# Patient Record
Sex: Male | Born: 1965 | Race: White | Hispanic: No | Marital: Married | Smoking: Never smoker
Health system: Southern US, Community
[De-identification: ages and names within clinical notes are randomized; demographics above are authoritative.]

## PROBLEM LIST (undated history)

## (undated) DIAGNOSIS — E785 Hyperlipidemia, unspecified: Secondary | ICD-10-CM

## (undated) HISTORY — PX: NECK SURGERY: SHX720

## (undated) HISTORY — PX: SHOULDER SURGERY: SHX246

## (undated) HISTORY — PX: TONSILLECTOMY: SUR1361

---

## 2005-05-09 ENCOUNTER — Emergency Department (HOSPITAL_COMMUNITY): Admission: EM | Admit: 2005-05-09 | Discharge: 2005-05-09 | Payer: Self-pay | Admitting: Emergency Medicine

## 2005-06-03 ENCOUNTER — Ambulatory Visit (HOSPITAL_COMMUNITY)
Admission: RE | Admit: 2005-06-03 | Discharge: 2005-06-03 | Payer: Self-pay | Admitting: Physical Medicine and Rehabilitation

## 2008-07-14 ENCOUNTER — Emergency Department (HOSPITAL_COMMUNITY): Admission: EM | Admit: 2008-07-14 | Discharge: 2008-07-14 | Payer: Self-pay | Admitting: Emergency Medicine

## 2010-04-16 LAB — COMPREHENSIVE METABOLIC PANEL
ALT: 27 U/L (ref 0–53)
AST: 23 U/L (ref 0–37)
Albumin: 4.6 g/dL (ref 3.5–5.2)
Alkaline Phosphatase: 88 U/L (ref 39–117)
BUN: 12 mg/dL (ref 6–23)
CO2: 25 mEq/L (ref 19–32)
Calcium: 9.8 mg/dL (ref 8.4–10.5)
Chloride: 105 mEq/L (ref 96–112)
Creatinine, Ser: 1 mg/dL (ref 0.4–1.5)
GFR calc Af Amer: >60 mL/min (ref >60)
GFR calc non Af Amer: >60 mL/min (ref >60)
Glucose, Bld: 91 mg/dL (ref 70–99)
Potassium: 4.3 mEq/L (ref 3.5–5.1)
Sodium: 139 mEq/L (ref 135–145)
Total Bilirubin: 0.5 mg/dL (ref 0.3–1.2)
Total Protein: 7.6 g/dL (ref 6.0–8.3)

## 2010-04-16 LAB — DIFFERENTIAL
Basophils Absolute: 0.1 10*3/uL (ref 0.0–0.1)
Basophils Relative: 1 % (ref 0–1)
Eosinophils Absolute: 0.2 10*3/uL (ref 0.0–0.7)
Eosinophils Relative: 3 % (ref 0–5)
Lymphocytes Relative: 26 % (ref 12–46)
Lymphs Abs: 1.9 10*3/uL (ref 0.7–4.0)
Monocytes Absolute: 0.5 10*3/uL (ref 0.1–1.0)
Monocytes Relative: 7 % (ref 3–12)
Neutro Abs: 4.7 10*3/uL (ref 1.7–7.7)
Neutrophils Relative %: 64 % (ref 43–77)

## 2010-04-16 LAB — URINALYSIS, ROUTINE W REFLEX MICROSCOPIC
Bilirubin Urine: NEGATIVE
Glucose, UA: NEGATIVE mg/dL
Hgb urine dipstick: NEGATIVE
Ketones, ur: NEGATIVE mg/dL
Nitrite: NEGATIVE
Protein, ur: NEGATIVE mg/dL
Specific Gravity, Urine: 1.012 (ref 1.005–1.030)
Urobilinogen, UA: 0.2 mg/dL (ref 0.0–1.0)
pH: 6 (ref 5.0–8.0)

## 2010-04-16 LAB — CBC
HCT: 45.7 % (ref 39.0–52.0)
Hemoglobin: 16 g/dL (ref 13.0–17.0)
MCHC: 35 g/dL (ref 30.0–36.0)
MCV: 86.1 fL (ref 78.0–100.0)
Platelets: 236 10*3/uL (ref 150–400)
RBC: 5.3 MIL/uL (ref 4.22–5.81)
RDW: 13.2 % (ref 11.5–15.5)
WBC: 7.3 10*3/uL (ref 4.0–10.5)

## 2010-04-16 LAB — LIPASE, BLOOD: Lipase: 22 U/L (ref 11–59)

## 2012-12-30 ENCOUNTER — Encounter: Payer: Self-pay | Admitting: Emergency Medicine

## 2012-12-30 ENCOUNTER — Emergency Department (INDEPENDENT_AMBULATORY_CARE_PROVIDER_SITE_OTHER)
Admission: EM | Admit: 2012-12-30 | Discharge: 2012-12-30 | Disposition: A | Discharge status: Home / Self Care | Payer: Managed Care, Other (non HMO) | Source: Home / Self Care | Attending: Family Medicine | Admitting: Family Medicine

## 2012-12-30 DIAGNOSIS — Z113 Encounter for screening for infections with a predominantly sexual mode of transmission: Secondary | ICD-10-CM

## 2012-12-30 HISTORY — DX: Hyperlipidemia, unspecified: E78.5

## 2012-12-30 NOTE — ED Provider Notes (Signed)
CSN: [id number]     Arrival date & time 12/30/12  1740 History   First MD Initiated Contact with Patient 12/30/12 1758     Chief Complaint  Patient presents with  . Exposure to STD     HPI Comments: Patient reports that his wife has just been diagnosed with a herpes-like genital rash.  Patient presents for Herpes testing.  He is completely assymptomatic at present.  He states that he has never had an STD, although he has had an occasional cold sore in the past.  The history is provided by the patient and the spouse.    Past Medical History  Diagnosis Date  . Hyperlipidemia    Past Surgical History  Procedure Laterality Date  . Neck surgery    . Shoulder surgery Left    Family History  Problem Relation Age of Onset  . Hypertension Father   . Heart failure Father    History  Substance Use Topics  . Smoking status: Never Smoker   . Smokeless tobacco: Not on file  . Alcohol Use: No    Review of Systems  Constitutional: Negative.   HENT: Negative.   Eyes: Negative.   Respiratory: Negative.   Cardiovascular: Negative.   Gastrointestinal: Negative.   Endocrine: Negative.   Genitourinary: Negative.   Musculoskeletal: Negative.   Skin: Negative.     Allergies  Penicillins  Home Medications   Current Outpatient Rx  Name  Route  Sig  Dispense  Refill  . rosuvastatin (CRESTOR) 20 MG tablet   Oral   Take 20 mg by mouth daily.          BP 128/81  Pulse 89  Temp(Src) 98.1 F (36.7 C) (Oral)  Resp 16  Ht 6\' 1"  (1.854 m)  Wt 213 lb (96.616 kg)  BMI 28.11 kg/m2  SpO2 95% Physical Exam Nursing notes and Vital Signs reviewed. Appearance:  Patient appears healthy, stated age, and in no acute distress Eyes:  Pupils are equal, round, and reactive to light and accomodation.  Extraocular movement is intact.  Conjunctivae are not inflamed .  Pharynx:  Normal Neck:  Supple.  No adenopathy Lungs:  Clear to auscultation.  Breath sounds are equal.  Heart:  Regular rate  and rhythm without murmurs, rubs, or gallops.  Skin:  No rash present.   ED Course  Procedures  none    MDM   1. Screen for STD (sexually transmitted disease)    Check for HSV 1 & 2 antibodies, IgG and Igm Info on [employer] given    Lattie Haw, MD 12/30/12 [phone]

## 2012-12-30 NOTE — ED Notes (Signed)
Uncertain STD exposure to HSV; desires testing.

## 2012-12-31 LAB — HSV(HERPES SIMPLEX VRS) I + II AB-IGG: HSV 1 Glycoprotein G Ab, IgG: <0.1 IV

## 2013-01-02 LAB — HSV(HERPES SIMPLEX VRS) I + II AB-IGM: Herpes Simplex Vrs I&II-IgM Ab (EIA): 0.45 INDEX

## 2013-01-03 ENCOUNTER — Telehealth: Payer: Self-pay | Admitting: Emergency Medicine

## 2013-09-07 ENCOUNTER — Encounter: Payer: Self-pay | Admitting: Emergency Medicine

## 2013-09-07 ENCOUNTER — Emergency Department (INDEPENDENT_AMBULATORY_CARE_PROVIDER_SITE_OTHER)
Admission: EM | Admit: 2013-09-07 | Discharge: 2013-09-07 | Disposition: A | Discharge status: Home / Self Care | Payer: BC Managed Care – PPO | Source: Home / Self Care | Attending: Family Medicine | Admitting: Family Medicine

## 2013-09-07 ENCOUNTER — Emergency Department (INDEPENDENT_AMBULATORY_CARE_PROVIDER_SITE_OTHER): Payer: BC Managed Care – PPO

## 2013-09-07 DIAGNOSIS — J209 Acute bronchitis, unspecified: Secondary | ICD-10-CM

## 2013-09-07 DIAGNOSIS — R509 Fever, unspecified: Secondary | ICD-10-CM

## 2013-09-07 DIAGNOSIS — R05 Cough: Secondary | ICD-10-CM

## 2013-09-07 DIAGNOSIS — R059 Cough, unspecified: Secondary | ICD-10-CM

## 2013-09-07 DIAGNOSIS — R0989 Other specified symptoms and signs involving the circulatory and respiratory systems: Secondary | ICD-10-CM

## 2013-09-07 LAB — POCT CBC W AUTO DIFF (K'VILLE URGENT CARE)

## 2013-09-07 LAB — POCT INFLUENZA A/B
INFLUENZA A, POC: NEGATIVE
INFLUENZA B, POC: NEGATIVE

## 2013-09-07 MED ORDER — BENZONATATE 200 MG PO CAPS: 200.0000 mg | ORAL_CAPSULE | Freq: Every day | ORAL | Status: DC

## 2013-09-07 MED ORDER — AZITHROMYCIN 250 MG PO TABS: ORAL_TABLET | ORAL | Status: DC

## 2013-09-07 NOTE — Discharge Instructions (Signed)
Take plain Mucinex (1200 mg guaifenesin) twice daily for cough and congestion.  May add Sudafed for sinus congestion.   Increase fluid intake, rest. °May use Afrin nasal spray (or generic oxymetazoline) twice daily for about 5 days.  Also recommend using saline nasal spray several times daily and saline nasal irrigation (AYR is a common brand) °Try warm salt water gargles for sore throat.  °Stop all antihistamines for now, and other non-prescription cough/cold preparations. °May take Ibuprofen 200mg, 4 tabs every 8 hours with food for chest/sternum discomfort. °  °Follow-up with family doctor if not improving 7 to 10 days.  °

## 2013-09-07 NOTE — ED Provider Notes (Signed)
CSN: [id number]     Arrival date & time 09/07/13  8295 History   First MD Initiated Contact with Patient 09/07/13 [phone]     Chief Complaint  Patient presents with  . Cough  . Fever      HPI Comments: Patient developed fatigue and chills four days ago.  The next day he developed chest congestion and tightness in his anterior chest.  Two days ago he developed swollen lymph nodes in neck, myalgias, sinus congestion and sore throat.  Yesterday he had fever to 102.  He now has a cough, worse at night, and he complains of bilateral earache.  The history is provided by the patient.    Past Medical History  Diagnosis Date  . Hyperlipidemia    Past Surgical History  Procedure Laterality Date  . Neck surgery    . Shoulder surgery Left   . Tonsillectomy     Family History  Problem Relation Age of Onset  . Hypertension Father   . Heart failure Father    History  Substance Use Topics  . Smoking status: Never Smoker   . Smokeless tobacco: Not on file  . Alcohol Use: Yes     Comment: 2 drinks q wk    Review of Systems + sore throat + cough No pleuritic pain, but has tightness in anterior chest + wheezing + nasal congestion + post-nasal drainage No sinus pain/pressure No itchy/red eyes + earache No hemoptysis No SOB + fever, + chills No nausea No vomiting No abdominal pain No diarrhea No urinary symptoms No skin rash + fatigue + myalgias + headache Used OTC meds without relief  Allergies  Penicillins  Home Medications   Prior to Admission medications   Medication Sig Start Date End Date Taking? Authorizing Provider  azithromycin (ZITHROMAX Z-PAK) 250 MG tablet Take 2 tabs today; then begin one tab once daily for 4 more days. 09/07/13   Lattie Haw, MD  benzonatate (TESSALON) 200 MG capsule Take 1 capsule (200 mg total) by mouth at bedtime. Take as needed for cough 09/07/13   Lattie Haw, MD  rosuvastatin (CRESTOR) 20 MG tablet Take 20 mg by mouth daily.     Historical Provider, MD   BP 110/72  Pulse 105  Temp(Src) 99.8 F (37.7 C) (Oral)  Resp 18  Ht  (1.854 m)  Wt 217 lb (98.431 kg)  BMI 28.64 kg/m2  SpO2 96% Physical Exam Nursing notes and Vital Signs reviewed. Appearance:  Patient appears healthy, stated age, and in no acute distress Eyes:  Pupils are equal, round, and reactive to light and accomodation.  Extraocular movement is intact.  Conjunctivae are not inflamed  Ears:  Canals normal.  Tympanic membranes normal.  Nose:  Mildly congested turbinates.  No sinus tenderness.   Pharynx:  Normal Neck:  Supple.   Tender enlarged posterior nodes are palpated bilaterally  Lungs:  Clear to auscultation.  Breath sounds are equal.  Chest:  Distinct tenderness to palpation over the mid-sternum.  Heart:  Regular rate and rhythm without murmurs, rubs, or gallops.  Abdomen:  Nontender without masses or hepatosplenomegaly.  Bowel sounds are present.  No CVA or flank tenderness.  Extremities:  No edema.  No calf tenderness Skin:  No rash present.   ED Course  Procedures  None    Labs Reviewed  POCT CBC W AUTO DIFF ([facility]):  WBC 9.2; LY 14.7; MO 4.5; GR 80.8; Hgb 15.2; Platelets 196   POCT INFLUENZA A/B  negative    Imaging Review Dg Chest 2 View  09/07/2013   CLINICAL DATA:  Cough fever and congestion for 4 days  EXAM: CHEST  2 VIEW  COMPARISON:  None.  FINDINGS: The lungs are mildly hypoinflated. There is no focal infiltrate. The lung markings are coarse inferiorly on the right but this may be due to crowding. The cardiac silhouette is top-normal in size. The pulmonary vascularity is normal. There is no pleural effusion. The mediastinum is normal in width. The bony thorax is unremarkable.  IMPRESSION: There is no definite acute pneumonia. Infrahilar subsegmental atelectasis on the right is not excluded. If the patient can tolerate the procedure, a deep inspiratory PA and lateral chest x-ray would be useful.   Electronically  Signed   By: David  [place]   On: 09/07/2013 10:22     MDM   1. Acute bronchitis, unspecified organism    Begin Z-pack to cover atypicals.  Prescription written for Benzonatate [facility]) to take at bedtime for night-time cough.  Take plain Mucinex (1200 mg guaifenesin) twice daily for cough and congestion.  May add Sudafed for sinus congestion.   Increase fluid intake, rest. May use Afrin nasal spray (or generic oxymetazoline) twice daily for about 5 days.  Also recommend using saline nasal spray several times daily and saline nasal irrigation (AYR is a common brand) Try warm salt water gargles for sore throat.  Stop all antihistamines for now, and other non-prescription cough/cold preparations. May take Ibuprofen , 4 tabs every 8 hours with food for chest/sternum discomfort.   Follow-up with family doctor if not improving 7 to 10 days.     Lattie Haw, MD 09/12/13 [phone]

## 2013-09-07 NOTE — ED Notes (Signed)
Pt c/o nonproductive cough, fever, swollen lymph nodes, and bilateral ear ache x 3 days. He took IBF  at 0600, he reports his temp was 102F.

## 2013-09-09 ENCOUNTER — Telehealth: Payer: Self-pay | Admitting: *Deleted

## 2015-03-24 ENCOUNTER — Other Ambulatory Visit: Payer: Self-pay

## 2015-03-24 ENCOUNTER — Emergency Department (HOSPITAL_COMMUNITY): Payer: BLUE CROSS/BLUE SHIELD

## 2015-03-24 ENCOUNTER — Observation Stay (HOSPITAL_COMMUNITY)
Admission: EM | Admit: 2015-03-24 | Discharge: 2015-03-25 | Disposition: A | Discharge status: Home / Self Care | Payer: BLUE CROSS/BLUE SHIELD | Attending: Oncology | Admitting: Oncology

## 2015-03-24 ENCOUNTER — Observation Stay (HOSPITAL_COMMUNITY): Payer: BLUE CROSS/BLUE SHIELD

## 2015-03-24 ENCOUNTER — Encounter (HOSPITAL_COMMUNITY): Payer: Self-pay | Admitting: Emergency Medicine

## 2015-03-24 DIAGNOSIS — R0789 Other chest pain: Secondary | ICD-10-CM | POA: Diagnosis not present

## 2015-03-24 DIAGNOSIS — Z8719 Personal history of other diseases of the digestive system: Secondary | ICD-10-CM

## 2015-03-24 DIAGNOSIS — K219 Gastro-esophageal reflux disease without esophagitis: Secondary | ICD-10-CM | POA: Diagnosis present

## 2015-03-24 DIAGNOSIS — R531 Weakness: Secondary | ICD-10-CM | POA: Insufficient documentation

## 2015-03-24 DIAGNOSIS — E876 Hypokalemia: Secondary | ICD-10-CM | POA: Diagnosis not present

## 2015-03-24 DIAGNOSIS — R0602 Shortness of breath: Secondary | ICD-10-CM | POA: Diagnosis not present

## 2015-03-24 DIAGNOSIS — Z8249 Family history of ischemic heart disease and other diseases of the circulatory system: Secondary | ICD-10-CM | POA: Insufficient documentation

## 2015-03-24 DIAGNOSIS — Z8661 Personal history of infections of the central nervous system: Secondary | ICD-10-CM

## 2015-03-24 DIAGNOSIS — N62 Hypertrophy of breast: Secondary | ICD-10-CM | POA: Diagnosis not present

## 2015-03-24 DIAGNOSIS — J90 Pleural effusion, not elsewhere classified: Secondary | ICD-10-CM | POA: Diagnosis not present

## 2015-03-24 DIAGNOSIS — Z880 Allergy status to penicillin: Secondary | ICD-10-CM | POA: Diagnosis not present

## 2015-03-24 DIAGNOSIS — J189 Pneumonia, unspecified organism: Secondary | ICD-10-CM

## 2015-03-24 DIAGNOSIS — Z6828 Body mass index (BMI) 28.0-28.9, adult: Secondary | ICD-10-CM | POA: Insufficient documentation

## 2015-03-24 DIAGNOSIS — R091 Pleurisy: Secondary | ICD-10-CM

## 2015-03-24 DIAGNOSIS — J9811 Atelectasis: Secondary | ICD-10-CM | POA: Insufficient documentation

## 2015-03-24 DIAGNOSIS — Z8711 Personal history of peptic ulcer disease: Secondary | ICD-10-CM

## 2015-03-24 DIAGNOSIS — E785 Hyperlipidemia, unspecified: Secondary | ICD-10-CM | POA: Diagnosis present

## 2015-03-24 DIAGNOSIS — Z87442 Personal history of urinary calculi: Secondary | ICD-10-CM | POA: Diagnosis present

## 2015-03-24 DIAGNOSIS — R079 Chest pain, unspecified: Secondary | ICD-10-CM | POA: Diagnosis present

## 2015-03-24 DIAGNOSIS — R0902 Hypoxemia: Secondary | ICD-10-CM | POA: Diagnosis present

## 2015-03-24 DIAGNOSIS — I959 Hypotension, unspecified: Secondary | ICD-10-CM

## 2015-03-24 DIAGNOSIS — D649 Anemia, unspecified: Secondary | ICD-10-CM | POA: Diagnosis not present

## 2015-03-24 DIAGNOSIS — E663 Overweight: Secondary | ICD-10-CM | POA: Diagnosis not present

## 2015-03-24 LAB — HEPATIC FUNCTION PANEL
ALT: 30 U/L (ref 17–63)
AST: 23 U/L (ref 15–41)
Albumin: 3.3 g/dL — ABNORMAL LOW (ref 3.5–5.0)
Alkaline Phosphatase: 76 U/L (ref 38–126)
BILIRUBIN DIRECT: 0.2 mg/dL (ref 0.1–0.5)
Indirect Bilirubin: 0.4 mg/dL (ref 0.3–0.9)
Total Bilirubin: 0.6 mg/dL (ref 0.3–1.2)
Total Protein: 6.2 g/dL — ABNORMAL LOW (ref 6.5–8.1)

## 2015-03-24 LAB — CBC WITH DIFFERENTIAL/PLATELET
BASOS PCT: 0 %
Basophils Absolute: 0 10*3/uL (ref 0.0–0.1)
Eosinophils Absolute: 0.5 10*3/uL (ref 0.0–0.7)
Eosinophils Relative: 5 %
HEMATOCRIT: 37.8 % — AB (ref 39.0–52.0)
Hemoglobin: 12.3 g/dL — ABNORMAL LOW (ref 13.0–17.0)
LYMPHS ABS: 1.8 10*3/uL (ref 0.7–4.0)
Lymphocytes Relative: 16 %
MCH: 28.6 pg (ref 26.0–34.0)
MCHC: 32.5 g/dL (ref 30.0–36.0)
MCV: 87.9 fL (ref 78.0–100.0)
MONO ABS: 0.9 10*3/uL (ref 0.1–1.0)
MONOS PCT: 9 %
NEUTROS ABS: 7.6 10*3/uL (ref 1.7–7.7)
Neutrophils Relative %: 70 %
Platelets: 156 10*3/uL (ref 150–400)
RBC: 4.3 MIL/uL (ref 4.22–5.81)
RDW: 13.4 % (ref 11.5–15.5)
WBC: 10.8 10*3/uL — ABNORMAL HIGH (ref 4.0–10.5)

## 2015-03-24 LAB — URINALYSIS, ROUTINE W REFLEX MICROSCOPIC
BILIRUBIN URINE: NEGATIVE
Glucose, UA: NEGATIVE mg/dL
Hgb urine dipstick: NEGATIVE
Ketones, ur: NEGATIVE mg/dL
LEUKOCYTES UA: NEGATIVE
NITRITE: NEGATIVE
PH: 6 (ref 5.0–8.0)
Protein, ur: NEGATIVE mg/dL
SPECIFIC GRAVITY, URINE: 1.029 (ref 1.005–1.030)

## 2015-03-24 LAB — LIPID PANEL
CHOL/HDL RATIO: 4 ratio
CHOLESTEROL: 176 mg/dL (ref 0–200)
HDL: 44 mg/dL (ref >40)
LDL CALC: 102 mg/dL — AB (ref 0–99)
TRIGLYCERIDES: 148 mg/dL (ref <150)
VLDL: 30 mg/dL (ref 0–40)

## 2015-03-24 LAB — STREP PNEUMONIAE URINARY ANTIGEN: Strep Pneumo Urinary Antigen: NEGATIVE

## 2015-03-24 LAB — I-STAT CG4 LACTIC ACID, ED: LACTIC ACID, VENOUS: 0.9 mmol/L (ref 0.5–2.0)

## 2015-03-24 LAB — BASIC METABOLIC PANEL
ANION GAP: 9 (ref 5–15)
BUN: 13 mg/dL (ref 6–20)
CALCIUM: 7 mg/dL — AB (ref 8.9–10.3)
CO2: 22 mmol/L (ref 22–32)
Chloride: 113 mmol/L — ABNORMAL HIGH (ref 101–111)
Creatinine, Ser: 0.94 mg/dL (ref 0.61–1.24)
GFR calc Af Amer: >60 mL/min (ref >60)
GFR calc non Af Amer: >60 mL/min (ref >60)
GLUCOSE: 101 mg/dL — AB (ref 65–99)
Potassium: 3.2 mmol/L — ABNORMAL LOW (ref 3.5–5.1)
Sodium: 144 mmol/L (ref 135–145)

## 2015-03-24 LAB — TROPONIN I

## 2015-03-24 LAB — RAPID URINE DRUG SCREEN, HOSP PERFORMED
Amphetamines: NOT DETECTED
Barbiturates: NOT DETECTED
Benzodiazepines: NOT DETECTED
Cocaine: NOT DETECTED
OPIATES: POSITIVE — AB
Tetrahydrocannabinol: NOT DETECTED

## 2015-03-24 LAB — I-STAT TROPONIN, ED: Troponin i, poc: 0 ng/mL (ref 0.00–0.08)

## 2015-03-24 LAB — CK: CK TOTAL: 105 U/L (ref 49–397)

## 2015-03-24 LAB — CBG MONITORING, ED: Glucose-Capillary: 97 mg/dL (ref 65–99)

## 2015-03-24 LAB — PROCALCITONIN: Procalcitonin: <0.1 ng/mL

## 2015-03-24 LAB — PROTIME-INR
INR: 1.16 (ref 0.00–1.49)
PROTHROMBIN TIME: 15 s (ref 11.6–15.2)

## 2015-03-24 LAB — MAGNESIUM: Magnesium: 1.8 mg/dL (ref 1.7–2.4)

## 2015-03-24 LAB — PHOSPHORUS: Phosphorus: 2.8 mg/dL (ref 2.5–4.6)

## 2015-03-24 LAB — APTT: aPTT: 26 seconds (ref 24–37)

## 2015-03-24 MED ORDER — SENNOSIDES-DOCUSATE SODIUM 8.6-50 MG PO TABS
1.0000 | ORAL_TABLET | Freq: Every evening | ORAL | Status: DC | PRN
  Filled 2015-03-24: qty 1

## 2015-03-24 MED ORDER — SODIUM CHLORIDE 0.9 % IV BOLUS (SEPSIS)
1000.0000 mL | Freq: Once | INTRAVENOUS | Status: AC
  Administered 2015-03-24: 1000 mL via INTRAVENOUS

## 2015-03-24 MED ORDER — FENTANYL CITRATE (PF) 100 MCG/2ML IJ SOLN
100.0000 ug | INTRAMUSCULAR | Status: DC | PRN
  Administered 2015-03-24: 100 ug via INTRAVENOUS

## 2015-03-24 MED ORDER — ACETAMINOPHEN 650 MG RE SUPP: 650.0000 mg | Freq: Four times a day (QID) | RECTAL | Status: DC | PRN

## 2015-03-24 MED ORDER — SODIUM CHLORIDE 0.9% FLUSH
3.0000 mL | Freq: Two times a day (BID) | INTRAVENOUS | Status: DC
  Administered 2015-03-24 – 2015-03-25 (×3): 3 mL via INTRAVENOUS

## 2015-03-24 MED ORDER — ACETAMINOPHEN 325 MG PO TABS: 650.0000 mg | ORAL_TABLET | Freq: Four times a day (QID) | ORAL | Status: DC | PRN

## 2015-03-24 MED ORDER — CYCLOBENZAPRINE HCL 10 MG PO TABS
5.0000 mg | ORAL_TABLET | Freq: Three times a day (TID) | ORAL | Status: DC | PRN
  Administered 2015-03-24 – 2015-03-25 (×4): 5 mg via ORAL
  Filled 2015-03-24 (×4): qty 1

## 2015-03-24 MED ORDER — FENTANYL CITRATE (PF) 100 MCG/2ML IJ SOLN
50.0000 ug | INTRAMUSCULAR | Status: DC | PRN
Start: 2015-03-24 — End: 2015-03-25
  Administered 2015-03-25: 50 ug via INTRAVENOUS
  Filled 2015-03-24: qty 2

## 2015-03-24 MED ORDER — KETOROLAC TROMETHAMINE 30 MG/ML IJ SOLN
30.0000 mg | Freq: Four times a day (QID) | INTRAMUSCULAR | Status: DC | PRN
  Administered 2015-03-24 – 2015-03-25 (×3): 30 mg via INTRAVENOUS
  Filled 2015-03-24 (×3): qty 1

## 2015-03-24 MED ORDER — ONDANSETRON HCL 4 MG/2ML IJ SOLN
4.0000 mg | Freq: Once | INTRAMUSCULAR | Status: AC
  Administered 2015-03-24: 4 mg via INTRAVENOUS
  Filled 2015-03-24: qty 2

## 2015-03-24 MED ORDER — ENOXAPARIN SODIUM 40 MG/0.4ML ~~LOC~~ SOLN
40.0000 mg | SUBCUTANEOUS | Status: DC
  Administered 2015-03-24 – 2015-03-25 (×2): 40 mg via SUBCUTANEOUS
  Filled 2015-03-24 (×2): qty 0.4

## 2015-03-24 MED ORDER — DEXTROSE 5 % IV SOLN
500.0000 mg | Freq: Once | INTRAVENOUS | Status: DC
  Filled 2015-03-24: qty 500

## 2015-03-24 MED ORDER — DEXTROSE 5 % IV SOLN
1.0000 g | Freq: Once | INTRAVENOUS | Status: DC
  Filled 2015-03-24: qty 10

## 2015-03-24 MED ORDER — FENTANYL CITRATE (PF) 100 MCG/2ML IJ SOLN
100.0000 ug | Freq: Once | INTRAMUSCULAR | Status: AC
  Administered 2015-03-24: 100 ug via INTRAVENOUS
  Filled 2015-03-24: qty 2

## 2015-03-24 MED ORDER — IOHEXOL 350 MG/ML SOLN
100.0000 mL | Freq: Once | INTRAVENOUS | Status: AC | PRN
  Administered 2015-03-24: 100 mL via INTRAVENOUS

## 2015-03-24 MED ORDER — DICLOFENAC SODIUM 1 % TD GEL
2.0000 g | Freq: Four times a day (QID) | TRANSDERMAL | Status: DC
  Administered 2015-03-24 – 2015-03-25 (×6): 2 g via TOPICAL
  Filled 2015-03-24: qty 100

## 2015-03-24 MED ORDER — DEXTROSE 5 % IV SOLN
500.0000 mg | INTRAVENOUS | Status: DC
  Administered 2015-03-24: 500 mg via INTRAVENOUS
  Filled 2015-03-24: qty 500

## 2015-03-24 MED ORDER — DEXTROSE 5 % IV SOLN: 1.0000 g | INTRAVENOUS | Status: DC

## 2015-03-24 MED ORDER — FENTANYL CITRATE (PF) 100 MCG/2ML IJ SOLN
100.0000 ug | INTRAMUSCULAR | Status: DC | PRN
  Administered 2015-03-24: 100 ug via INTRAVENOUS
  Filled 2015-03-24 (×2): qty 2

## 2015-03-24 MED ORDER — INFLUENZA VAC SPLIT QUAD 0.5 ML IM SUSY: 0.5000 mL | PREFILLED_SYRINGE | INTRAMUSCULAR | Status: DC | PRN

## 2015-03-24 MED ORDER — DEXTROSE 5 % IV SOLN
2.0000 g | INTRAVENOUS | Status: DC
  Administered 2015-03-24: 2 g via INTRAVENOUS
  Filled 2015-03-24: qty 2

## 2015-03-24 NOTE — ED Notes (Signed)
PER [facility]: Patient to ED from home c/o sharp L sided CP radiating to back accompanied by SOB, diaphoresis, and nausea starting yesterday - intermittent, worsened this morning. EMS VS: ST 110s-120s, 150/80, 95% 3L O2 [place]. 20g. L hand, given 4mg  zofran, 324 ASA, and 1 NTG, no relief. Patient A&O x 4. Per EMS, patient's HR decreased significantly to 40s SB for a few minutes and increased back up to the 70s. BP at this time 86/60.

## 2015-03-24 NOTE — ED Notes (Addendum)
BPs taken in both arms, 101/65 R arm and 119/78 L arm. PA-C at bedside.

## 2015-03-24 NOTE — Progress Notes (Addendum)
Pharmacy Antibiotic Note  Bob LedererJohn S Harris is a 50 y.o. male admitted on 03/24/2015 with pneumonia. Code Sepsis called. Pharmacy has been consulted for ceftriaxone/azithromycin dosing. Patient with noted rash/hives reaction to PCN - ok to give cephalosporin per PA. Patient states not anaphylaxis. Afeb, wbc 10.8.  Plan: CTX 2g IV q24h - de-escalate dose when sepsis resolved Azithro 500mg  IV q24h Monitor clinical progress, c/s, LOT  Height: 6\' 1"  (185.4 cm) Weight: 210 lb (95.255 kg) IBW/kg (Calculated) : 79.9  Temp (24hrs), Avg:98 F (36.7 C), Min:97.8 F (36.6 C), Max:98.2 F (36.8 C)   Recent Labs Lab 03/24/15 0555  WBC 10.8*  CREATININE 0.94    Estimated Creatinine Clearance: 105.1 mL/min (by C-G formula based on Cr of 0.94).    Allergies  Allergen Reactions  . Penicillins Hives and Rash    Antimicrobials this admission: 3/16 ctx >>  3/16 azithro >>   Dose adjustments this admission: n/a  Microbiology results: 3/16 BCx:  3/16 UCx:    Babs BertinHaley Desta Bujak, PharmD, BCPS Clinical Pharmacist Pager [phone] 03/24/2015 8:38 AM

## 2015-03-24 NOTE — ED Notes (Signed)
PA-C at bedside 

## 2015-03-24 NOTE — ED Notes (Signed)
Attempted report states room needs to be approved.

## 2015-03-24 NOTE — H&P (Signed)
Date: 03/24/2015               Patient Name:  Bob Harris MRN: [id number]  DOB: [birth year] Age / Sex: 50 y.o., male   PCP: No primary care provider on file.         Medical Service: Internal Medicine Teaching Service         Attending Physician: Dr. Levert Feinstein, MD    First Contact: Dr. Loney Loh Pager: [phone]  Second Contact: Dr. Isabella Bowens Pager: [phone]       After Hours (After 5p/  First Contact Pager: [phone]  weekends / holidays): Second Contact Pager: [phone]   Chief Complaint: chest pain  History of Present Illness:   Bob Harris is a pleasant 50 year old man with past medical history of hyperlipidemia, viral meningitis x 2, gastric ulcer, nephrolithiasis, and GERD who presents with chest pain.   He reports feeling in his normal state of health until two days ago when he began having dull left sided intermittent achy pain that wraps around his left side just under his breast with radiation to his neck while at work. He took OTC ibuprofen with no relief and had to sleep in a recliner due to pain for the past 2 nights. He took old oxycodone he had with mild relief of pain. Last night his pain progressively worsened to >10/10 pain and become more sharp with inability to take a deep breath. His pain is better leaning forward than lying supine. He reports pain with palpation of left side of his chest.  He denies prior cardiac history or anginal symptoms. He denies fever, chills, rash, URI symptoms, cough, LE edema/pain, headache, myalgias, palpitations, nausea, vomiting, or change in BM/urination. He reports recent sick contacts at his job and was recently visiting his mother in the hospital. He did not receive flu shot this year. He has never had PE or DVT. He has had chicken pox but never had shingles. He reports having viral meningitis in the 1990's and in 2003. He reports having gastric ulcers and GERD not currently on medical therapy but denies recent acid reflux symptoms. He  denies recent injury, trauma, or heavy lifting. He normally does cardio on a stationary bike but denies starting any new exercise regimens. He reports having similar left sided chest pain in the same area in May of 2016 after starting crestor for hyperlipidemia. His doctor told him he "popped cartilage" and he discontinued the medication on his own. His symptoms resolved after a few months and had exercise cardiac stress testing which he reports was normal (had done in [place]). He recently started pitavastatin two weeks ago. He called his doctor yesterday who recommended he continue taking the medication and start coenzyme Q 10 which he did not take. His father had MI at age 52 with quadruple bypass. He does not smoke and drinks 1 beer every other day. He denies recreational drug use.   He received SL nitroglycerin and aspirin on route to the ED with no relief of symptoms. He was hypotensive to 87/57 on ED admission thought to be due to nitroglycerin admission. He reports being diaphoretic and feeling that he was going to pass out at that time. He was also found to be hypoxic to 86-88% on RA requiring supplemental oxygen (3L [place]). 12-lead EKG revealed no ischemic changes and troponin x1 was negative. CTA chest/abdomen/pelvis revealed no PE, aortic dissection, nephrolithiasis, or aneurysm. It did show small left pleural effusion and bibasilar infiltrates suggesting  possible atelectasis vs pneumonia. His WBC was 10.8K and was started on ceftriaxone and azithromycin for CAP. He was also started on fentanyl with improvement of pain to 5/10. He still has difficulty taking a deep breath due to pain.    Meds: Pitavastatin 2 mg daily    Allergies: Allergies as of 03/24/2015 - Review Complete 03/24/2015  Allergen Reaction Noted  . Penicillins Hives and Rash 12/30/2012   Past Medical History  Diagnosis Date  . Hyperlipidemia    Past Surgical History  Procedure Laterality Date  . Neck surgery    .  Shoulder surgery Left   . Tonsillectomy     Family History  Problem Relation Age of Onset  . Hypertension Father   . Heart failure Father    Social History   Social History  . Marital Status: Married    Spouse Name: N/A  . Number of Children: N/A  . Years of Education: N/A   Occupational History  . Not on file.   Social History Main Topics  . Smoking status: Never Smoker   . Smokeless tobacco: Not on file  . Alcohol Use: Yes     Comment: 2 drinks q wk  . Drug Use: No  . Sexual Activity: Not on file   Other Topics Concern  . Not on file   Social History Narrative    Review of Systems: Review of Systems  Constitutional: Positive for weight loss (12 lb intentional in 1 year). Negative for fever, chills, malaise/fatigue and diaphoresis (resolved).  HENT: Negative for congestion and sore throat.   Eyes: Negative for blurred vision.  Respiratory: Positive for shortness of breath (due to pain). Negative for cough, sputum production and wheezing.   Cardiovascular: Positive for chest pain. Negative for palpitations, orthopnea and leg swelling.  Gastrointestinal: Negative for heartburn, nausea, vomiting, abdominal pain, diarrhea, constipation and blood in stool.  Genitourinary: Negative for dysuria, urgency, frequency, hematuria and flank pain.  Musculoskeletal: Positive for neck pain (muscle spasms). Negative for myalgias, back pain and falls.  Neurological: Negative for dizziness (resolved), sensory change, focal weakness and headaches.  Psychiatric/Behavioral: Negative for substance abuse. The patient has insomnia.       Physical Exam: Blood pressure 115/70, pulse 97, temperature 97.8 F (36.6 C), temperature source Oral, resp. rate 23, height 6\' 1"  (1.854 m), weight 210 lb (95.255 kg), SpO2 98 %.  Physical Exam  Constitutional: He is oriented to person, place, and time. He appears well-developed and well-nourished. No distress.  HENT:  Head: Normocephalic and  atraumatic.  Right Ear: External ear normal.  Left Ear: External ear normal.  Nose: Nose normal.  Mouth/Throat: Oropharynx is clear and moist. No oropharyngeal exudate.  Eyes: Conjunctivae and EOM are normal. Pupils are equal, round, and reactive to light. Right eye exhibits no discharge. Left eye exhibits no discharge. No scleral icterus.  Neck: Normal range of motion. Neck supple.  Cardiovascular: Normal rate, regular rhythm and normal heart sounds.  Exam reveals no friction rub.   No murmur heard. Pulmonary/Chest: No respiratory distress. He has no wheezes. He has no rales. He exhibits tenderness (Just below left lateral side of breast, no vesicles present).  Bilateral gynecomastia. Poor respiratory effort due to pain.   Abdominal: Soft. Bowel sounds are normal. He exhibits no distension. There is no tenderness. There is no rebound and no guarding.  Musculoskeletal: Normal range of motion. He exhibits no edema or tenderness.  Neurological: He is alert and oriented to person, place, and time. No  cranial nerve deficit.  Normal 5/5 muscle strength and normal sensation to light touch of extremities.   Skin: Skin is warm and dry. No rash noted. He is not diaphoretic. No erythema. No pallor.  Psychiatric: He has a normal mood and affect. His behavior is normal. Judgment and thought content normal.     Lab results: Basic Metabolic Panel:  Recent Labs  16/10/96 0555  NA 144  K 3.2*  CL 113*  CO2 22  GLUCOSE 101*  BUN 13  CREATININE 0.94  CALCIUM 7.0*   Liver Function Tests:  Recent Labs  03/24/15 0924  AST 23  ALT 30  ALKPHOS 76  BILITOT 0.6  PROT 6.2*  ALBUMIN 3.3*  CBC:  Recent Labs  03/24/15 0555  WBC 10.8*  NEUTROABS 7.6  HGB 12.3*  HCT 37.8*  MCV 87.9  PLT 156   CBG:  Recent Labs  03/24/15 0552  GLUCAP 97   Urinalysis:  Recent Labs  03/24/15 0645  COLORURINE YELLOW  LABSPEC 1.029  PHURINE 6.0  GLUCOSEU NEGATIVE  HGBUR NEGATIVE  BILIRUBINUR  NEGATIVE  KETONESUR NEGATIVE  PROTEINUR NEGATIVE  NITRITE NEGATIVE  LEUKOCYTESUR NEGATIVE    Imaging results:  Dg Chest Port 1 View  03/24/2015  CLINICAL DATA:  Chest pain radiating to the back. EXAM: PORTABLE CHEST 1 VIEW COMPARISON:  09/07/2013 FINDINGS: Low volume chest with patchy basilar lung opacity. The left-sided opacity appears ill-defined and dense. No definitive cardiomegaly when accounting for technique. Upper mediastinal widening which could be related to volumes. No edema, effusion, or air leak. IMPRESSION: 1. Bibasilar opacity that is primarily concerning for pneumonia, especially on the left. 2. Low volumes. Electronically Signed   By: Marnee Spring M.D.   On: 03/24/2015 06:09   Ct Angio Chest Aorta W/cm &/or Wo/cm  03/24/2015  CLINICAL DATA:  Stabbing chest pain EXAM: CT ANGIOGRAPHY CHEST, ABDOMEN AND PELVIS TECHNIQUE: Multidetector CT imaging through the chest, abdomen and pelvis was performed using the standard protocol during bolus administration of intravenous contrast. Multiplanar reconstructed images and MIPs were obtained and reviewed to evaluate the vascular anatomy. CONTRAST:  OMNIPAQUE IOHEXOL 350 MG/ML SOLN COMPARISON:  Chest x-ray 03/24/2015 FINDINGS: CTA CHEST FINDINGS Unenhanced images reveal no evidence of aortic hematoma. Postcontrast images reveal no evidence of aortic aneurysm or dissection. Heart size normal. No pericardial effusion. Negative for coronary calcification. Negative for pulmonary emboli. Pulmonary arteries normal in caliber. Small left pleural effusion. Left lower lobe airspace disease compatible with pneumonia or atelectasis. Mild right lower lobe airspace disease. Elevated right hemidiaphragm. Negative for thoracic spine fracture or mass lesion. Review of the MIP images confirms the above findings. CTA ABDOMEN AND PELVIS FINDINGS Negative for aortic aneurysm or dissection. Normal aortic caliber without significant atherosclerotic disease.  Negative for retroperitoneal hematoma. Iliac arteries patent bilaterally. Single renal artery bilaterally patent. Celiac and SMA are patent. IMA patent. Fatty infiltration of the liver. 15 mm hypodensity in the caudal aspect of the right lobe liver does not enhance on delayed images. This may represent a cyst. This has water density. Gallbladder and bile ducts normal. Pancreas and spleen normal. Kidneys show symmetric perfusion.  No renal obstruction or mass. Negative for mass or adenopathy.  No free fluid Negative for bowel obstruction. No bowel edema or mass. Mild sigmoid diverticulosis. Negative for diverticulitis. Appendix not well visualized. No evidence of acute appendicitis. No significant skeletal abnormality. Review of the MIP images confirms the above findings. IMPRESSION: Negative for aortic aneurysm or dissection.  No acute  abnormality Fatty infiltration   of the liver. Small left pleural effusion. Bibasilar infiltrates, possible pneumonia versus atelectasis. Electronically Signed   By: Marlan Palau M.D.   On: 03/24/2015 08:24   Ct Angio Abd/pel W/ And/or W/o  03/24/2015  CLINICAL DATA:  Stabbing chest pain EXAM: CT ANGIOGRAPHY CHEST, ABDOMEN AND PELVIS TECHNIQUE: Multidetector CT imaging through the chest, abdomen and pelvis was performed using the standard protocol during bolus administration of intravenous contrast. Multiplanar reconstructed images and MIPs were obtained and reviewed to evaluate the vascular anatomy. CONTRAST:  OMNIPAQUE IOHEXOL 350 MG/ML SOLN COMPARISON:  Chest x-ray 03/24/2015 FINDINGS: CTA CHEST FINDINGS Unenhanced images reveal no evidence of aortic hematoma. Postcontrast images reveal no evidence of aortic aneurysm or dissection. Heart size normal. No pericardial effusion. Negative for coronary calcification. Negative for pulmonary emboli. Pulmonary arteries normal in caliber. Small left pleural effusion. Left lower lobe airspace disease compatible with pneumonia or  atelectasis. Mild right lower lobe airspace disease. Elevated right hemidiaphragm. Negative for thoracic spine fracture or mass lesion. Review of the MIP images confirms the above findings. CTA ABDOMEN AND PELVIS FINDINGS Negative for aortic aneurysm or dissection. Normal aortic caliber without significant atherosclerotic disease. Negative for retroperitoneal hematoma. Iliac arteries patent bilaterally. Single renal artery bilaterally patent. Celiac and SMA are patent. IMA patent. Fatty infiltration of the liver. 15 mm hypodensity in the caudal aspect of the right lobe liver does not enhance on delayed images. This may represent a cyst. This has water density. Gallbladder and bile ducts normal. Pancreas and spleen normal. Kidneys show symmetric perfusion.  No renal obstruction or mass. Negative for mass or adenopathy.  No free fluid Negative for bowel obstruction. No bowel edema or mass. Mild sigmoid diverticulosis. Negative for diverticulitis. Appendix not well visualized. No evidence of acute appendicitis. No significant skeletal abnormality. Review of the MIP images confirms the above findings. IMPRESSION: Negative for aortic aneurysm or dissection.  No acute abnormality Fatty infiltration   of the liver. Small left pleural effusion. Bibasilar infiltrates, possible pneumonia versus atelectasis. Electronically Signed   By: Marlan Palau M.D.   On: 03/24/2015 08:24    Other results: EKG:  Ventricular Rate: 82 PR Interval: 162 QRS Duration: 78 QT Interval: 381 QTC Calculation: 445 R Axis: 34 Text Interpretation: Sinus rhythm No significant change since last tracing    Assessment & Plan by Problem:  Atypical Chest pain - Pt with 2-day history of severe left sided chest pain with no preceding injury/trauma with tenderness to palpation of chest on exam below breast. 12-lead EKG with no ischemic changes and troponin negative with reported normal cardiac exercise stress testing last year. CTA  chest with no PE or dissection but reveals left sided lower lobe airspace disease most likely due to atelectasis. He has no fever, chills, cough, or leukocytosis to suggest pneumonia. Lactic acid level was normal on admission. He has had previous chest pain in the same area after starting crestor and was recently started on pitavastatin 2 weeks ago. He denies myalgias or myositis. It does have potential for Coenzyme Q 10 deficiency which can present with chest pain but this seems unlikely given unilateral point tenderness of pain. He does have history of gastric ulcer and GERD but no recent acid reflux symptoms. No rub on exam or EKG findings to suggest pericarditis. No prodrome or rash to suggest shingles. He denies recreational drug use. Etiology most likely musculoskeletal in nature (costochondiritis) due to possible muscle strain. His pain has improved with high  dose fentanyl in the ED. His blood pressure improved with IVF's. He continues to have poor respiratory effort due to pain.   -Monitor on telemetry  -Obtain left rib film to evaluate for fracture  -Oxygen therapy to keep SpO2 >92% -Start incentive spirometry Q 4 hr while awake -Wean fentanyl from 100 Q 1 hr to 50 4 hr PRN severe pain -Start toradol 30 mg Q 6 hr PRN and voltaren gel 2 g QID to area  -Start flexeril 5 mg TID PRN neck muscle spasms -Repeat 12-lead EKG in AM and cycle troponins  -Obtain UDS -Will discontinue antibiotics as there is no concern for pneumonia, follow-up blood cultures x2 drawn in ED  Hyperlipidemia - No recent lipid panel. Pt was previously on crestor and had similar pain in the same area. He recently started pitavastatin 2 weeks ago. He denies myalgias or myositis. It does have potential for Coenzyme Q 10 deficiency which can present with chest pain but this seems unlikely in setting of unilateral point tenderness of pain.   -Obtain lipid panel and CK level -Hold newly started pitavastatin   Hypocalcemia -  Corrected calcium 7.6 with no prior history of low calcium levels. Possibly due to hyperventilation vs vitamin D deficiency vs hypoparathyroidism. Rhabdomyolysis unlikely. -Obtain phosphorous level  -Repeat in AM, if still low consider PTH and 25-OH vitamin D level  Mild normocytic anemia - Pt with Hg 12.3 with last one 15.3 on 09/07/13 with no active bleeding.  -Repeat CBC in AM and obtain smear review -Consider anemia panel if does not resolve  -Needs outpatient screening colonoscopy due to age >28  Overweight - BMI 27.8 not at goal <25.  -Encourage weight loss and lifestyle modification  HIV and HCV Screening -Pt with no prior screening  -Obtain HIV Ab and HCV Ab  Diet: Regular DVT Ppx: Lovenox Code: Full    Dispo: Disposition is deferred at this time, awaiting improvement of current medical problems. Anticipated discharge in approximately 1 day(s).   The patient does have a current PCP (No primary care provider on file.) and does need an [facility] hospital follow-up appointment after discharge.  The patient does not have transportation limitations that hinder transportation to clinic appointments.  Signed: Otis Brace, MD 03/24/2015, 9:59 AM

## 2015-03-24 NOTE — Progress Notes (Signed)
Pharmacy Code Sepsis Protocol  Time of code sepsis page: [phone] [x]  Antibiotics delivered at: 0855  Were antibiotics ordered at the time of the code sepsis page? Yes Was it required to contact the physician? [x]  Physician not contacted []  Physician contacted to order antibiotics for code sepsis []  Physician contacted to recommend changing antibiotics  Pharmacy consulted for: CTX/azithro  Anti-infectives    Start     Dose/Rate Route Frequency Ordered Stop   03/24/15 0900  cefTRIAXone (ROCEPHIN) 1 g in dextrose 5 % 50 mL IVPB  Status:  Discontinued     1 g 100 mL/hr over 30 Minutes Intravenous Every 24 hours 03/24/15 0849 03/24/15 0849   03/24/15 0900  azithromycin (ZITHROMAX) 500 mg in dextrose 5 % 250 mL IVPB     500 mg 250 mL/hr over 60 Minutes Intravenous Every 24 hours 03/24/15 0849     03/24/15 0900  cefTRIAXone (ROCEPHIN) 2 g in dextrose 5 % 50 mL IVPB     2 g 100 mL/hr over 30 Minutes Intravenous Every 24 hours 03/24/15 0849     03/24/15 0845  cefTRIAXone (ROCEPHIN) 1 g in dextrose 5 % 50 mL IVPB  Status:  Discontinued     1 g 100 mL/hr over 30 Minutes Intravenous  Once 03/24/15 0834 03/24/15 0849   03/24/15 0845  azithromycin (ZITHROMAX) 500 mg in dextrose 5 % 250 mL IVPB  Status:  Discontinued     500 mg 250 mL/hr over 60 Minutes Intravenous  Once 03/24/15 0834 03/24/15 0849        Nurse education provided: [x]  Minutes left to administer antibiotics to achieve 1 hour goal [x]  Correct order of antibiotic administration [x]  Antibiotic Y-site compatibilities     Babs BertinHaley Nahome Bublitz, PharmD, BCPS Clinical Pharmacist Pager [phone] 03/24/2015 8:52 AM

## 2015-03-24 NOTE — ED Notes (Signed)
Admit Doctor at bedside.  

## 2015-03-24 NOTE — ED Notes (Signed)
Pt transported to the imaging dept.  

## 2015-03-24 NOTE — ED Notes (Signed)
Phlebotomy at the bedside  

## 2015-03-24 NOTE — Progress Notes (Signed)
SATURATION QUALIFICATIONS: (This note is used to comply with regulatory documentation for home oxygen)  Patient Saturations on Room Air at Rest = 90%  Patient Saturations on Room Air while Ambulating = 89%-92%  Patient Saturations on 2 Liters of oxygen while Ambulating = 92%  Pt ambulated 210 feet in the hallway. Pt's saturations mostly stayed between 90% and 92%. Pt did not feel like he was able to take a deep breath due to the pain in his side. Pt tolerated the ambulation on room air well.

## 2015-03-24 NOTE — ED Provider Notes (Signed)
CSN: [id number]     Arrival date & time 03/24/15  [id number] History   First MD Initiated Contact with Patient 03/24/15 [phone]     Chief Complaint  Patient presents with  . Chest Pain     (Consider location/radiation/quality/duration/timing/severity/associated sxs/prior Treatment) HPI This is a 50 year old male who presents emergency Department with chief complaint of chest pain. Patient states that his pain began 2 days ago as intermittent, severe pain in his left chest that radiates to his back. He called his primary care physician who recommended that he take some co- Q10. Patient states that last night after he got off work about 4:30 PM he began having constant chest pain on the left side that is severe, radiating to his back and neck. He has associated bracing. He feels slightly better when sitting up or leaning forward. Worse when lying back. He describes the pain as severe and stabbing. He has a history of multiple kidney stones but states that this does not feel the stain, and he denies any urinary symptoms. He has had no recent URI symptoms or fevers. No recent trauma to the chest. He states that just prior to arriving at the emergency department, he got extremely sweaty and nauseous and felt as if he was going to pass out. The patient was given aspirin full dose and a dose of a true glycerin. He arrived with hypotension. However, we have suspicion that it dropped due to the nitroglycerin. The patient has a history of hyperlipidemia but denies a history of smoking or high cholesterol. He denies a history of cardiac disease. His father had a quadruple bypass at the age of 52. He denies any other family history of myocardial infarction. Patient denies any recent confinement, used of exogenous estrogens, unilateral legs swelling, or history of pulmonary embolus. Past Medical History  Diagnosis Date  . Hyperlipidemia    Past Surgical History  Procedure Laterality Date  . Neck surgery    . Shoulder  surgery Left   . Tonsillectomy     Family History  Problem Relation Age of Onset  . Hypertension Father   . Heart failure Father    Social History  Substance Use Topics  . Smoking status: Never Smoker   . Smokeless tobacco: None  . Alcohol Use: Yes     Comment: 2 drinks q wk    Review of Systems  Ten systems reviewed and are negative for acute change, except as noted in the HPI.    Allergies  Penicillins  Home Medications   Prior to Admission medications   Medication Sig Start Date End Date Taking? Authorizing Provider  Pitavastatin Calcium (LIVALO) 2 MG TABS Take 2 mg by mouth daily.   Yes Historical Provider, MD   BP 115/70 mmHg  Pulse 97  Temp(Src) 97.8 F (36.6 C) (Oral)  Resp 23  Ht 6\' 1"  (1.854 m)  Wt 95.255 kg  BMI 27.71 kg/m2  SpO2 98% Physical Exam  Constitutional: He is oriented to person, place, and time. He appears well-developed and well-nourished.  Patient sitting upright, occasionally clutching his chest. He appears extremely uncomfortable. At times he is unable to take any deep breaths or speak due to severe pain. Patient is grimacing and moaning. He appears distracted by his level of pain  HENT:  Head: Normocephalic and atraumatic.  Eyes: Conjunctivae are normal.  Neck: Normal range of motion. No JVD present.  Cardiovascular: Normal rate and regular rhythm.   Equal pulses in the upper and lower extremities,  no skin mottling. Toes are warm and well perfused with less than 3 second cap refill  Pulmonary/Chest: No respiratory distress. He exhibits no tenderness.  Poor effort. Bracing. Unable to take full breaths.  Abdominal: Soft. Bowel sounds are normal. He exhibits no distension and no mass. There is no rebound and no guarding.  Left CVA tenderness  Neurological: He is alert and oriented to person, place, and time.  Skin: Skin is dry.  Nursing note and vitals reviewed.   ED Course  Procedures (including critical care time) Labs Review Labs  Reviewed  BASIC METABOLIC PANEL - Abnormal; Notable for the following:    Potassium 3.2 (*)    Chloride 113 (*)    Glucose, Bld 101 (*)    Calcium 7.0 (*)    All other components within normal limits  CBC WITH DIFFERENTIAL/PLATELET - Abnormal; Notable for the following:    WBC 10.8 (*)    Hemoglobin 12.3 (*)    HCT 37.8 (*)    All other components within normal limits  CULTURE, BLOOD (ROUTINE X 2)  CULTURE, BLOOD (ROUTINE X 2)  URINE CULTURE  CULTURE, EXPECTORATED SPUTUM-ASSESSMENT  GRAM STAIN  URINALYSIS, ROUTINE W REFLEX MICROSCOPIC (NOT AT [facility])  HEPATIC FUNCTION PANEL  MAGNESIUM  TROPONIN I  TROPONIN I  HIV ANTIBODY (ROUTINE TESTING)  HEPATITIS C ANTIBODY (REFLEX)  PROCALCITONIN  STREP PNEUMONIAE URINARY ANTIGEN  LEGIONELLA ANTIGEN, URINE  PROTIME-INR  APTT  I-STAT TROPOININ, ED  CBG MONITORING, ED  I-STAT CG4 LACTIC ACID, ED  I-STAT CG4 LACTIC ACID, ED    Imaging Review Dg Chest Port 1 View  03/24/2015  CLINICAL DATA:  Chest pain radiating to the back. EXAM: PORTABLE CHEST 1 VIEW COMPARISON:  09/07/2013 FINDINGS: Low volume chest with patchy basilar lung opacity. The left-sided opacity appears ill-defined and dense. No definitive cardiomegaly when accounting for technique. Upper mediastinal widening which could be related to volumes. No edema, effusion, or air leak. IMPRESSION: 1. Bibasilar opacity that is primarily concerning for pneumonia, especially on the left. 2. Low volumes. Electronically Signed   By: Marnee Spring M.D.   On: 03/24/2015 06:09   Ct Angio Chest Aorta W/cm &/or Wo/cm  03/24/2015  CLINICAL DATA:  Stabbing chest pain EXAM: CT ANGIOGRAPHY CHEST, ABDOMEN AND PELVIS TECHNIQUE: Multidetector CT imaging through the chest, abdomen and pelvis was performed using the standard protocol during bolus administration of intravenous contrast. Multiplanar reconstructed images and MIPs were obtained and reviewed to evaluate the vascular anatomy. CONTRAST:   OMNIPAQUE IOHEXOL 350 MG/ML SOLN COMPARISON:  Chest x-ray 03/24/2015 FINDINGS: CTA CHEST FINDINGS Unenhanced images reveal no evidence of aortic hematoma. Postcontrast images reveal no evidence of aortic aneurysm or dissection. Heart size normal. No pericardial effusion. Negative for coronary calcification. Negative for pulmonary emboli. Pulmonary arteries normal in caliber. Small left pleural effusion. Left lower lobe airspace disease compatible with pneumonia or atelectasis. Mild right lower lobe airspace disease. Elevated right hemidiaphragm. Negative for thoracic spine fracture or mass lesion. Review of the MIP images confirms the above findings. CTA ABDOMEN AND PELVIS FINDINGS Negative for aortic aneurysm or dissection. Normal aortic caliber without significant atherosclerotic disease. Negative for retroperitoneal hematoma. Iliac arteries patent bilaterally. Single renal artery bilaterally patent. Celiac and SMA are patent. IMA patent. Fatty infiltration of the liver. 15 mm hypodensity in the caudal aspect of the right lobe liver does not enhance on delayed images. This may represent a cyst. This has water density. Gallbladder and bile ducts normal. Pancreas and spleen normal.  Kidneys show symmetric perfusion.  No renal obstruction or mass. Negative for mass or adenopathy.  No free fluid Negative for bowel obstruction. No bowel edema or mass. Mild sigmoid diverticulosis. Negative for diverticulitis. Appendix not well visualized. No evidence of acute appendicitis. No significant skeletal abnormality. Review of the MIP images confirms the above findings. IMPRESSION: Negative for aortic aneurysm or dissection.  No acute abnormality Fatty infiltration   of the liver. Small left pleural effusion. Bibasilar infiltrates, possible pneumonia versus atelectasis. Electronically Signed   By: Marlan Palau M.D.   On: 03/24/2015 08:24   Ct Angio Abd/pel W/ And/or W/o  03/24/2015  CLINICAL DATA:  Stabbing chest pain EXAM:  CT ANGIOGRAPHY CHEST, ABDOMEN AND PELVIS TECHNIQUE: Multidetector CT imaging through the chest, abdomen and pelvis was performed using the standard protocol during bolus administration of intravenous contrast. Multiplanar reconstructed images and MIPs were obtained and reviewed to evaluate the vascular anatomy. CONTRAST:  OMNIPAQUE IOHEXOL 350 MG/ML SOLN COMPARISON:  Chest x-ray 03/24/2015 FINDINGS: CTA CHEST FINDINGS Unenhanced images reveal no evidence of aortic hematoma. Postcontrast images reveal no evidence of aortic aneurysm or dissection. Heart size normal. No pericardial effusion. Negative for coronary calcification. Negative for pulmonary emboli. Pulmonary arteries normal in caliber. Small left pleural effusion. Left lower lobe airspace disease compatible with pneumonia or atelectasis. Mild right lower lobe airspace disease. Elevated right hemidiaphragm. Negative for thoracic spine fracture or mass lesion. Review of the MIP images confirms the above findings. CTA ABDOMEN AND PELVIS FINDINGS Negative for aortic aneurysm or dissection. Normal aortic caliber without significant atherosclerotic disease. Negative for retroperitoneal hematoma. Iliac arteries patent bilaterally. Single renal artery bilaterally patent. Celiac and SMA are patent. IMA patent. Fatty infiltration of the liver. 15 mm hypodensity in the caudal aspect of the right lobe liver does not enhance on delayed images. This may represent a cyst. This has water density. Gallbladder and bile ducts normal. Pancreas and spleen normal. Kidneys show symmetric perfusion.  No renal obstruction or mass. Negative for mass or adenopathy.  No free fluid Negative for bowel obstruction. No bowel edema or mass. Mild sigmoid diverticulosis. Negative for diverticulitis. Appendix not well visualized. No evidence of acute appendicitis. No significant skeletal abnormality. Review of the MIP images confirms the above findings. IMPRESSION: Negative for aortic  aneurysm or dissection.  No acute abnormality Fatty infiltration   of the liver. Small left pleural effusion. Bibasilar infiltrates, possible pneumonia versus atelectasis. Electronically Signed   By: Marlan Palau M.D.   On: 03/24/2015 08:24   I have personally reviewed and evaluated these images and lab results as part of my medical decision-making.   EKG Interpretation   Date/Time:  Thursday March 24 2015 05:34:43 EDT Ventricular Rate:  82 PR Interval:  162 QRS Duration: 78 QT Interval:  381 QTC Calculation: 445 R Axis:   34 Text Interpretation:  Sinus rhythm No significant change since last  tracing Confirmed by LITTLE MD, RACHEL [phone]) on 03/24/2015 5:42:35 AM      MDM   Final diagnoses:  CAP (community acquired pneumonia)  Pleural effusion  Hypoxia  Hypotension, unspecified hypotension type      9:44 AM BP 115/70 mmHg  Pulse 97  Temp(Src) 97.8 F (36.6 C) (Oral)  Resp 23  Ht  (1.854 m)  Wt 95.255 kg  BMI 27.71 kg/m2  SpO2 98% Patient here with hypotension. He has an unequal upper extremity. Systolic pressures with 101 in the right upper extremity and 119 in the left upper extremity.  Differential includes aortic dissection, pulmonary embolus, probable left renal stone, pericarditis. The patient's chest x-ray is read as possible pneumonia. However, I feel that his haziness is secondary to atelectasis is. He is guarding heavily and only sipping air into the lungs. I have given the patient some IV fentanyl with moderate relief of his pain. Awaiting a CT dissection study. I did discuss appropriate studies with Dr. Grace Isaac.  9:44 AM patinet was hypoxic on arrival of EMS at 5:30 am.He is on 3L via nasal cannula.? If this is due to guarding   Patient CT with BL CAP and pleural effusion. As he was hypotensive. With a white blood cell count. I went ahead and ordered sepsis protocol. I've spoken with the internal medicine residents who will admit the patient for  community-acquired pneumonia and hypoxia. His lactate is currently pending.  Arthor Captain, PA-C 03/24/15 1610  Laurence Spates, MD 03/29/15 1434

## 2015-03-25 ENCOUNTER — Observation Stay (HOSPITAL_COMMUNITY): Payer: BLUE CROSS/BLUE SHIELD

## 2015-03-25 ENCOUNTER — Encounter: Payer: Self-pay | Admitting: Internal Medicine

## 2015-03-25 DIAGNOSIS — E663 Overweight: Secondary | ICD-10-CM | POA: Diagnosis not present

## 2015-03-25 DIAGNOSIS — Z6827 Body mass index (BMI) 27.0-27.9, adult: Secondary | ICD-10-CM

## 2015-03-25 DIAGNOSIS — D649 Anemia, unspecified: Secondary | ICD-10-CM

## 2015-03-25 DIAGNOSIS — R0789 Other chest pain: Secondary | ICD-10-CM | POA: Diagnosis not present

## 2015-03-25 DIAGNOSIS — R0781 Pleurodynia: Secondary | ICD-10-CM

## 2015-03-25 DIAGNOSIS — E785 Hyperlipidemia, unspecified: Secondary | ICD-10-CM

## 2015-03-25 LAB — BASIC METABOLIC PANEL
ANION GAP: 7 (ref 5–15)
BUN: 11 mg/dL (ref 6–20)
CALCIUM: 8.5 mg/dL — AB (ref 8.9–10.3)
CO2: 28 mmol/L (ref 22–32)
Chloride: 106 mmol/L (ref 101–111)
Creatinine, Ser: 1.03 mg/dL (ref 0.61–1.24)
Glucose, Bld: 117 mg/dL — ABNORMAL HIGH (ref 65–99)
POTASSIUM: 4.2 mmol/L (ref 3.5–5.1)
Sodium: 141 mmol/L (ref 135–145)

## 2015-03-25 LAB — CBC WITH DIFFERENTIAL/PLATELET
BASOS ABS: 0 10*3/uL (ref 0.0–0.1)
BASOS PCT: 0 %
Eosinophils Absolute: 0.4 10*3/uL (ref 0.0–0.7)
Eosinophils Relative: 4 %
HEMATOCRIT: 38.5 % — AB (ref 39.0–52.0)
Hemoglobin: 12.3 g/dL — ABNORMAL LOW (ref 13.0–17.0)
Lymphocytes Relative: 15 %
Lymphs Abs: 1.7 10*3/uL (ref 0.7–4.0)
MCH: 27.8 pg (ref 26.0–34.0)
MCHC: 31.9 g/dL (ref 30.0–36.0)
MCV: 86.9 fL (ref 78.0–100.0)
MONO ABS: 1 10*3/uL (ref 0.1–1.0)
Monocytes Relative: 9 %
NEUTROS ABS: 7.8 10*3/uL — AB (ref 1.7–7.7)
NEUTROS PCT: 71 %
PLATELETS: 173 10*3/uL (ref 150–400)
RBC: 4.43 MIL/uL (ref 4.22–5.81)
RDW: 13.3 % (ref 11.5–15.5)
WBC: 10.9 10*3/uL — AB (ref 4.0–10.5)

## 2015-03-25 LAB — HEPATITIS C ANTIBODY (REFLEX)

## 2015-03-25 LAB — TECHNOLOGIST SMEAR REVIEW

## 2015-03-25 LAB — LEGIONELLA ANTIGEN, URINE

## 2015-03-25 LAB — HCV COMMENT:

## 2015-03-25 LAB — URINE CULTURE

## 2015-03-25 LAB — C-REACTIVE PROTEIN: CRP: 9.9 mg/dL — ABNORMAL HIGH (ref <1.0)

## 2015-03-25 LAB — HIV ANTIBODY (ROUTINE TESTING W REFLEX): HIV SCREEN 4TH GENERATION: NONREACTIVE

## 2015-03-25 LAB — SEDIMENTATION RATE: SED RATE: 45 mm/h — AB (ref 0–16)

## 2015-03-25 MED ORDER — KETOROLAC TROMETHAMINE 10 MG PO TABS: 10.0000 mg | ORAL_TABLET | Freq: Four times a day (QID) | ORAL | Status: AC

## 2015-03-25 MED ORDER — KETOROLAC TROMETHAMINE 10 MG PO TABS
10.0000 mg | ORAL_TABLET | Freq: Four times a day (QID) | ORAL | Status: DC
  Administered 2015-03-25 (×2): 10 mg via ORAL
  Filled 2015-03-25 (×2): qty 1

## 2015-03-25 MED ORDER — DICLOFENAC SODIUM 1 % TD GEL: 2.0000 g | Freq: Four times a day (QID) | TRANSDERMAL | Status: AC

## 2015-03-25 MED ORDER — CYCLOBENZAPRINE HCL 5 MG PO TABS: 5.0000 mg | ORAL_TABLET | Freq: Three times a day (TID) | ORAL | Status: AC | PRN

## 2015-03-25 NOTE — Progress Notes (Signed)
Subjective: Patient was seen and examined at bedside. No acute overnight events. States his chest pain is much better today and he is able to take deep breaths more comfortably. No other complaints.   Objective: Vital signs in last 24 hours: Filed Vitals:   03/24/15 1100 03/24/15 1445 03/24/15 2013 03/25/15 0456  BP: 116/77 145/79 117/73 146/78  Pulse: 90 89 95 92  Temp:  98.8 F (37.1 C) 99.6 F (37.6 C) 98.2 F (36.8 C)  TempSrc:  Oral Oral Oral  Resp: Height:   (1.88 m)    Weight:  97 kg (213 lb 13.5 oz)  98.068 kg (216 lb 3.2 oz)  SpO2: 95% 97% 94% 94%   Weight change: 1.745 kg (3 lb 13.5 oz)  Intake/Output Summary (Last 24 hours) at 03/25/15 1219 Last data filed at 03/25/15 0800  Gross per 24 hour  Intake    720 ml  Output      0 ml  Net    720 ml   Physical Exam: Constitutional: He is oriented to person, place, and time. He appears well-developed and well-nourished. No distress.  Eyes: Pupils are equal, round, and reactive to light.  Cardiovascular: Normal rate, regular rhythm and normal heart sounds. Exam reveals no friction rub.  No murmur heard. Pulmonary/Chest: No respiratory distress. He has no wheezes. He has no rales. He exhibits tenderness (Just below left lateral side of breast, no vesicles present).   Abdominal: Soft. Bowel sounds are normal. He exhibits no distension. There is no tenderness. There is no rebound and no guarding.  Musculoskeletal: Normal range of motion. He exhibits no edema.  Skin: Skin is warm and dry. No rash noted.  Psychiatric: He has a normal mood and affect. His behavior is normal.  Lab Results: Basic Metabolic Panel:  Recent Labs Lab 03/24/15 0555 03/24/15 0924 03/24/15 1334 03/25/15 0420  NA 144  --   --  141  K 3.2*  --   --  4.2  CL 113*  --   --  106  CO2 22  --   --  28  GLUCOSE 101*  --   --  117*  BUN 13  --   --  11  CREATININE 0.94  --   --  1.03  CALCIUM 7.0*  --   --  8.5*  MG  --  1.8   --   --   PHOS  --   --  2.8  --    Liver Function Tests:  Recent Labs Lab 03/24/15 0924  AST 23  ALT 30  ALKPHOS 76  BILITOT 0.6  PROT 6.2*  ALBUMIN 3.3*   CBC:  Recent Labs Lab 03/24/15 0555 03/25/15 0420  WBC 10.8* 10.9*  NEUTROABS 7.6 7.8*  HGB 12.3* 12.3*  HCT 37.8* 38.5*  MCV 87.9 86.9  PLT 156 173   Cardiac Enzymes:  Recent Labs Lab 03/24/15 1101 03/24/15 1334 03/24/15 1901  CKTOTAL 105  --   --   TROPONINI  --  <0.03 <0.03   CBG:  Recent Labs Lab 03/24/15 0552  GLUCAP 97   Fasting Lipid Panel:  Recent Labs Lab 03/24/15 1334  CHOL 176  HDL 44  LDLCALC 102*  TRIG 148  CHOLHDL 4.0   Coagulation:  Recent Labs Lab 03/24/15 0924  LABPROT 15.0  INR 1.16   Urine Drug Screen: Drugs of Abuse     Component Value Date/Time   LABOPIA POSITIVE* 03/24/2015 1031  COCAINSCRNUR NONE DETECTED 03/24/2015 1031   LABBENZ NONE DETECTED 03/24/2015 1031   AMPHETMU NONE DETECTED 03/24/2015 1031   THCU NONE DETECTED 03/24/2015 1031   LABBARB NONE DETECTED 03/24/2015 1031    Urinalysis:  Recent Labs Lab 03/24/15 0645  COLORURINE YELLOW  LABSPEC 1.029  PHURINE 6.0  GLUCOSEU NEGATIVE  HGBUR NEGATIVE  BILIRUBINUR NEGATIVE  KETONESUR NEGATIVE  PROTEINUR NEGATIVE  NITRITE NEGATIVE  LEUKOCYTESUR NEGATIVE   Micro Results: No results found for this or any previous visit (from the past 240 hour(s)). Studies/Results: Dg Chest 2 View  03/25/2015  CLINICAL DATA:  Weakness and shortness of breath. Followup pulmonary infiltrates. EXAM: CHEST  2 VIEW COMPARISON:  Chest x-ray and chest CT 03/24/2015 FINDINGS: The cardiac silhouette, mediastinal an hilar contours are within normal limits and stable. Persistent bibasilar infiltrates and small effusions. No pneumothorax. IMPRESSION: Persistent bibasilar infiltrates and small effusions. Electronically Signed   By: Rudie Meyer M.D.   On: 03/25/2015 08:18   Dg Ribs Unilateral Left  03/24/2015  CLINICAL DATA:   Left-sided chest tenderness EXAM: LEFT RIBS - 2 VIEW COMPARISON:  Chest radiograph and chest CT March 24, 2015 FINDINGS: Oblique and cone-down lower left rib images were obtained. There is consolidation in the left lung base. There is node appreciable pneumothorax or effusion on the left. No rib fracture identified. IMPRESSION: No rib fracture evident.  Left base consolidation. Electronically Signed   By: Bretta Bang III M.D.   On: 03/24/2015 10:36   Dg Chest Port 1 View  03/24/2015  CLINICAL DATA:  Chest pain radiating to the back. EXAM: PORTABLE CHEST 1 VIEW COMPARISON:  09/07/2013 FINDINGS: Low volume chest with patchy basilar lung opacity. The left-sided opacity appears ill-defined and dense. No definitive cardiomegaly when accounting for technique. Upper mediastinal widening which could be related to volumes. No edema, effusion, or air leak. IMPRESSION: 1. Bibasilar opacity that is primarily concerning for pneumonia, especially on the left. 2. Low volumes. Electronically Signed   By: Marnee Spring M.D.   On: 03/24/2015 06:09   Ct Angio Chest Aorta W/cm &/or Wo/cm  03/24/2015  CLINICAL DATA:  Stabbing chest pain EXAM: CT ANGIOGRAPHY CHEST, ABDOMEN AND PELVIS TECHNIQUE: Multidetector CT imaging through the chest, abdomen and pelvis was performed using the standard protocol during bolus administration of intravenous contrast. Multiplanar reconstructed images and MIPs were obtained and reviewed to evaluate the vascular anatomy. CONTRAST:  OMNIPAQUE IOHEXOL 350 MG/ML SOLN COMPARISON:  Chest x-ray 03/24/2015 FINDINGS: CTA CHEST FINDINGS Unenhanced images reveal no evidence of aortic hematoma. Postcontrast images reveal no evidence of aortic aneurysm or dissection. Heart size normal. No pericardial effusion. Negative for coronary calcification. Negative for pulmonary emboli. Pulmonary arteries normal in caliber. Small left pleural effusion. Left lower lobe airspace disease compatible with  pneumonia or atelectasis. Mild right lower lobe airspace disease. Elevated right hemidiaphragm. Negative for thoracic spine fracture or mass lesion. Review of the MIP images confirms the above findings. CTA ABDOMEN AND PELVIS FINDINGS Negative for aortic aneurysm or dissection. Normal aortic caliber without significant atherosclerotic disease. Negative for retroperitoneal hematoma. Iliac arteries patent bilaterally. Single renal artery bilaterally patent. Celiac and SMA are patent. IMA patent. Fatty infiltration of the liver. 15 mm hypodensity in the caudal aspect of the right lobe liver does not enhance on delayed images. This may represent a cyst. This has water density. Gallbladder and bile ducts normal. Pancreas and spleen normal. Kidneys show symmetric perfusion.  No renal obstruction or mass. Negative for mass or adenopathy.  No free fluid Negative for bowel obstruction. No bowel edema or mass. Mild sigmoid diverticulosis. Negative for diverticulitis. Appendix not well visualized. No evidence of acute appendicitis. No significant skeletal abnormality. Review of the MIP images confirms the above findings. IMPRESSION: Negative for aortic aneurysm or dissection.  No acute abnormality Fatty infiltration   of the liver. Small left pleural effusion. Bibasilar infiltrates, possible pneumonia versus atelectasis. Electronically Signed   By: Marlan Palau M.D.   On: 03/24/2015 08:24   Ct Angio Abd/pel W/ And/or W/o  03/24/2015  CLINICAL DATA:  Stabbing chest pain EXAM: CT ANGIOGRAPHY CHEST, ABDOMEN AND PELVIS TECHNIQUE: Multidetector CT imaging through the chest, abdomen and pelvis was performed using the standard protocol during bolus administration of intravenous contrast. Multiplanar reconstructed images and MIPs were obtained and reviewed to evaluate the vascular anatomy. CONTRAST:  OMNIPAQUE IOHEXOL 350 MG/ML SOLN COMPARISON:  Chest x-ray 03/24/2015 FINDINGS: CTA CHEST FINDINGS Unenhanced images reveal no  evidence of aortic hematoma. Postcontrast images reveal no evidence of aortic aneurysm or dissection. Heart size normal. No pericardial effusion. Negative for coronary calcification. Negative for pulmonary emboli. Pulmonary arteries normal in caliber. Small left pleural effusion. Left lower lobe airspace disease compatible with pneumonia or atelectasis. Mild right lower lobe airspace disease. Elevated right hemidiaphragm. Negative for thoracic spine fracture or mass lesion. Review of the MIP images confirms the above findings. CTA ABDOMEN AND PELVIS FINDINGS Negative for aortic aneurysm or dissection. Normal aortic caliber without significant atherosclerotic disease. Negative for retroperitoneal hematoma. Iliac arteries patent bilaterally. Single renal artery bilaterally patent. Celiac and SMA are patent. IMA patent. Fatty infiltration of the liver. 15 mm hypodensity in the caudal aspect of the right lobe liver does not enhance on delayed images. This may represent a cyst. This has water density. Gallbladder and bile ducts normal. Pancreas and spleen normal. Kidneys show symmetric perfusion.  No renal obstruction or mass. Negative for mass or adenopathy.  No free fluid Negative for bowel obstruction. No bowel edema or mass. Mild sigmoid diverticulosis. Negative for diverticulitis. Appendix not well visualized. No evidence of acute appendicitis. No significant skeletal abnormality. Review of the MIP images confirms the above findings. IMPRESSION: Negative for aortic aneurysm or dissection.  No acute abnormality Fatty infiltration   of the liver. Small left pleural effusion. Bibasilar infiltrates, possible pneumonia versus atelectasis. Electronically Signed   By: Marlan Palau M.D.   On: 03/24/2015 08:24   Medications: I have reviewed the patient's current medications. Scheduled Meds: . diclofenac sodium  2 g Topical QID  . enoxaparin (LOVENOX) injection  40 mg Subcutaneous Q24H  . ketorolac  10 mg Oral 4 times  per day  . sodium chloride flush  3 mL Intravenous Q12H   Continuous Infusions:  PRN Meds:.acetaminophen **OR** acetaminophen, cyclobenzaprine, fentaNYL (SUBLIMAZE) injection, Influenza vac split quadrivalent PF, senna-docusate Assessment/Plan: Principal Problem:   Chest pain Active Problems:   Hyperlipidemia   Gynecomastia   Overweight   Normocytic anemia   GERD (gastroesophageal reflux disease)   History of viral meningitis   History of gastric ulcer   History of nephrolithiasis   Hypocalcemia   Hypokalemia  Pleuritic chest pain - Pt with 2-day history of severe left sided chest pain with no preceding injury/trauma with tenderness to palpation of chest on exam below breast. 12-lead EKG with no ischemic changes and troponin negative with reported normal cardiac exercise stress testing last year. CTA chest with no PE or dissection but reveals left sided lower lobe airspace disease most likely  due to atelectasis. He has no fever, chills, cough, or leukocytosis to suggest pneumonia. Lactic acid level was normal on admission. He has had previous chest pain in the same area after starting crestor and was recently started on pitavastatin 2 weeks ago. As such, drug induced lupus with associated pleuritisis on the differential. Sed rate and CRP elevated. He does have history of gastric ulcer and GERD but no recent acid reflux symptoms. No rub on exam or EKG findings to suggest pericarditis. No prodrome or rash to suggest shingles. Costochondritis is also on the differential due to possible muscle strain.  -Monitor on telemetry  -Obtain left rib film to evaluate for fracture  -Oxygen therapy to keep SpO2 >92% -Incentive spirometry -Toradol 10 mg q6  -Tylenol 650 mg q6 prn  -Voltaren gel  -Flexeril 5 mg TID PRN -Blood cx x 2 pending  -Pending labs: ANA, Histone antibody, Anti-DNA antibody, Anti-smith antibody  Hyperlipidemia - Lipid panel normal. CK level normal.  -Discontinue Pivastatin    Mild normocytic anemia - Pt with Hg 12.3 with last one 15.3 on 09/07/13 with no active bleeding.  -Will suggest PCP to check anemia panel as outpatient  -Needs outpatient screening colonoscopy due to age 80>50  Overweight - BMI 27.8 not at goal <25.  -Encourage weight loss and lifestyle modification  HIV and HCV Screening -Pt with no prior screening  -Obtain HIV Ab and HCV Ab  Diet: Regular DVT Ppx: Lovenox Code: Full   Dispo: Disposition is deferred at this time, awaiting improvement of current medical problems.  Anticipated discharge in approximately 1-2 day(s).   The patient does have a current PCP (No primary care provider on file.) and [facility].  The patient does not have transportation limitations that hinder transportation to clinic appointments.  .Services Needed at time of discharge: Y = Yes, Blank = No PT:   OT:   RN:   Equipment:   Other:       Jorgeluis GiovanniVasundhra Aerika Groll, MD 03/25/2015, 12:19 PM

## 2015-03-25 NOTE — Progress Notes (Signed)
Subjective: No acute events overnight. [name] says he is feeling better this morning, but still has sharp pain from his sternum along his ribs to his back when he moves and breathes deeply. He is able to take deeper breaths than yesterday, but still isn't back to baseline. Moving him forward to auscultate his posterior fields caused the patient to grimace. He denies any dizziness, lightheadedness, nausea, diaphoresis, or palpitations. His saturation yesterday on room air at rest was 90%, on room air while ambulating was 89-92%, and on 2L nasal cannula while ambulating was 92%. Today is saturation was 94% on room air and he was breathing comfortably.  His wife was present and his results and plan of care were discussed with both of them.  Objective: Vital signs in last 24 hours: Filed Vitals:   03/24/15 1100 03/24/15 1445 03/24/15 2013 03/25/15 0456  BP: 116/77 145/79 117/73 146/78  Pulse: 90 89 95 92  Temp:  98.8 F (37.1 C) 99.6 F (37.6 C) 98.2 F (36.8 C)  TempSrc:  Oral Oral Oral  Resp: 28 22 20 20   Height:  6' 2"  (1.88 m)    Weight:  97 kg (213 lb 13.5 oz)  98.068 kg (216 lb 3.2 oz)  SpO2: 95% 97% 94% 94%   Weight change: 1.745 kg (3 lb 13.5 oz)  Intake/Output Summary (Last 24 hours) at 03/25/15 1026 Last data filed at 03/24/15 2015  Gross per 24 hour  Intake    480 ml  Output      0 ml  Net    480 ml   Physical Exam:  General: Alert, awake, and oriented. Healthy appearing, in no acute distress. CV: Regular rate and rhythm. Normal S1, S2. No murmur, rub, or gallop. Pulmonary: Lungs were clear to auscultation bilaterally. No crackles noted today. Deep breathing difficult.  Skin: No skin changes noted along his left ribs at the painful area.   Lab Results: Basic Metabolic Panel:  Recent Labs Lab 03/24/15 0555 03/24/15 0924 03/24/15 1334 03/25/15 0420  NA 144  --   --  141  K 3.2*  --   --  4.2  CL 113*  --   --  106  CO2 22  --   --  28  GLUCOSE 101*  --    --  117*  BUN 13  --   --  11  CREATININE 0.94  --   --  1.03  CALCIUM 7.0*  --   --  8.5*  MG  --  1.8  --   --   PHOS  --   --  2.8  --    Liver Function Tests:  Recent Labs Lab 03/24/15 0924  AST 23  ALT 30  ALKPHOS 76  BILITOT 0.6  PROT 6.2*  ALBUMIN 3.3*   CBC:  Recent Labs Lab 03/24/15 0555 03/25/15 0420  WBC 10.8* 10.9*  NEUTROABS 7.6 7.8*  HGB 12.3* 12.3*  HCT 37.8* 38.5*  MCV 87.9 86.9  PLT 156 173   Cardiac Enzymes:  Recent Labs Lab 03/24/15 1101 03/24/15 1334 03/24/15 1901  CKTOTAL 105  --   --   TROPONINI  --  <0.03 <0.03   CBG:  Recent Labs Lab 03/24/15 0552  GLUCAP 97   Fasting Lipid Panel:  Recent Labs Lab 03/24/15 1334  CHOL 176  HDL 44  LDLCALC 102*  TRIG 148  CHOLHDL 4.0   Coagulation:  Recent Labs Lab 03/24/15 0924  LABPROT 15.0  INR 1.16  Urine Drug Screen: Drugs of Abuse     Component Value Date/Time   LABOPIA POSITIVE* 03/24/2015 1031   COCAINSCRNUR NONE DETECTED 03/24/2015 1031   LABBENZ NONE DETECTED 03/24/2015 1031   AMPHETMU NONE DETECTED 03/24/2015 1031   THCU NONE DETECTED 03/24/2015 1031   LABBARB NONE DETECTED 03/24/2015 1031    Urinalysis:  Recent Labs Lab 03/24/15 0645  COLORURINE YELLOW  LABSPEC 1.029  PHURINE 6.0  GLUCOSEU NEGATIVE  HGBUR NEGATIVE  BILIRUBINUR NEGATIVE  KETONESUR NEGATIVE  PROTEINUR NEGATIVE  NITRITE NEGATIVE  LEUKOCYTESUR NEGATIVE    Studies/Results: Dg Chest 2 View  03/25/2015  CLINICAL DATA:  Weakness and shortness of breath. Followup pulmonary infiltrates. EXAM: CHEST  2 VIEW COMPARISON:  Chest x-ray and chest CT 03/24/2015 FINDINGS: The cardiac silhouette, mediastinal an hilar contours are within normal limits and stable. Persistent bibasilar infiltrates and small effusions. No pneumothorax. IMPRESSION: Persistent bibasilar infiltrates and small effusions. Electronically Signed   By: Marijo Sanes M.D.   On: 03/25/2015 08:18   Dg Ribs Unilateral  Left  03/24/2015  CLINICAL DATA:  Left-sided chest tenderness EXAM: LEFT RIBS - 2 VIEW COMPARISON:  Chest radiograph and chest CT March 24, 2015 FINDINGS: Oblique and cone-down lower left rib images were obtained. There is consolidation in the left lung base. There is node appreciable pneumothorax or effusion on the left. No rib fracture identified. IMPRESSION: No rib fracture evident.  Left base consolidation. Electronically Signed   By: Lowella Grip III M.D.   On: 03/24/2015 10:36   Dg Chest Port 1 View  03/24/2015  CLINICAL DATA:  Chest pain radiating to the back. EXAM: PORTABLE CHEST 1 VIEW COMPARISON:  09/07/2013 FINDINGS: Low volume chest with patchy basilar lung opacity. The left-sided opacity appears ill-defined and dense. No definitive cardiomegaly when accounting for technique. Upper mediastinal widening which could be related to volumes. No edema, effusion, or air leak. IMPRESSION: 1. Bibasilar opacity that is primarily concerning for pneumonia, especially on the left. 2. Low volumes. Electronically Signed   By: Monte Fantasia M.D.   On: 03/24/2015 06:09   Ct Angio Chest Aorta W/cm &/or Wo/cm  03/24/2015  CLINICAL DATA:  Stabbing chest pain EXAM: CT ANGIOGRAPHY CHEST, ABDOMEN AND PELVIS TECHNIQUE: Multidetector CT imaging through the chest, abdomen and pelvis was performed using the standard protocol during bolus administration of intravenous contrast. Multiplanar reconstructed images and MIPs were obtained and reviewed to evaluate the vascular anatomy. CONTRAST:  161m OMNIPAQUE IOHEXOL 350 MG/ML SOLN COMPARISON:  Chest x-ray 03/24/2015 FINDINGS: CTA CHEST FINDINGS Unenhanced images reveal no evidence of aortic hematoma. Postcontrast images reveal no evidence of aortic aneurysm or dissection. Heart size normal. No pericardial effusion. Negative for coronary calcification. Negative for pulmonary emboli. Pulmonary arteries normal in caliber. Small left pleural effusion. Left lower lobe  airspace disease compatible with pneumonia or atelectasis. Mild right lower lobe airspace disease. Elevated right hemidiaphragm. Negative for thoracic spine fracture or mass lesion. Review of the MIP images confirms the above findings. CTA ABDOMEN AND PELVIS FINDINGS Negative for aortic aneurysm or dissection. Normal aortic caliber without significant atherosclerotic disease. Negative for retroperitoneal hematoma. Iliac arteries patent bilaterally. Single renal artery bilaterally patent. Celiac and SMA are patent. IMA patent. Fatty infiltration of the liver. 15 mm hypodensity in the caudal aspect of the right lobe liver does not enhance on delayed images. This may represent a cyst. This has water density. Gallbladder and bile ducts normal. Pancreas and spleen normal. Kidneys show symmetric perfusion.  No renal obstruction or  mass. Negative for mass or adenopathy.  No free fluid Negative for bowel obstruction. No bowel edema or mass. Mild sigmoid diverticulosis. Negative for diverticulitis. Appendix not well visualized. No evidence of acute appendicitis. No significant skeletal abnormality. Review of the MIP images confirms the above findings. IMPRESSION: Negative for aortic aneurysm or dissection.  No acute abnormality Fatty infiltration   of the liver. Small left pleural effusion. Bibasilar infiltrates, possible pneumonia versus atelectasis. Electronically Signed   By: Franchot Gallo M.D.   On: 03/24/2015 08:24   Ct Angio Abd/pel W/ And/or W/o  03/24/2015  CLINICAL DATA:  Stabbing chest pain EXAM: CT ANGIOGRAPHY CHEST, ABDOMEN AND PELVIS TECHNIQUE: Multidetector CT imaging through the chest, abdomen and pelvis was performed using the standard protocol during bolus administration of intravenous contrast. Multiplanar reconstructed images and MIPs were obtained and reviewed to evaluate the vascular anatomy. CONTRAST:  178m OMNIPAQUE IOHEXOL 350 MG/ML SOLN COMPARISON:  Chest x-ray 03/24/2015 FINDINGS: CTA CHEST  FINDINGS Unenhanced images reveal no evidence of aortic hematoma. Postcontrast images reveal no evidence of aortic aneurysm or dissection. Heart size normal. No pericardial effusion. Negative for coronary calcification. Negative for pulmonary emboli. Pulmonary arteries normal in caliber. Small left pleural effusion. Left lower lobe airspace disease compatible with pneumonia or atelectasis. Mild right lower lobe airspace disease. Elevated right hemidiaphragm. Negative for thoracic spine fracture or mass lesion. Review of the MIP images confirms the above findings. CTA ABDOMEN AND PELVIS FINDINGS Negative for aortic aneurysm or dissection. Normal aortic caliber without significant atherosclerotic disease. Negative for retroperitoneal hematoma. Iliac arteries patent bilaterally. Single renal artery bilaterally patent. Celiac and SMA are patent. IMA patent. Fatty infiltration of the liver. 15 mm hypodensity in the caudal aspect of the right lobe liver does not enhance on delayed images. This may represent a cyst. This has water density. Gallbladder and bile ducts normal. Pancreas and spleen normal. Kidneys show symmetric perfusion.  No renal obstruction or mass. Negative for mass or adenopathy.  No free fluid Negative for bowel obstruction. No bowel edema or mass. Mild sigmoid diverticulosis. Negative for diverticulitis. Appendix not well visualized. No evidence of acute appendicitis. No significant skeletal abnormality. Review of the MIP images confirms the above findings. IMPRESSION: Negative for aortic aneurysm or dissection.  No acute abnormality Fatty infiltration   of the liver. Small left pleural effusion. Bibasilar infiltrates, possible pneumonia versus atelectasis. Electronically Signed   By: CFranchot GalloM.D.   On: 03/24/2015 08:24   Medications: I have reviewed the patient's current medications. Scheduled Meds: . diclofenac sodium  2 g Topical QID  . enoxaparin (LOVENOX) injection  40 mg Subcutaneous  Q24H  . sodium chloride flush  3 mL Intravenous Q12H   Continuous Infusions:  PRN Meds:.acetaminophen **OR** acetaminophen, cyclobenzaprine, fentaNYL (SUBLIMAZE) injection, Influenza vac split quadrivalent PF, ketorolac, senna-docusate   Assessment/Plan:  Pleuritic chest pain: His pleuritic chest pain today is better than yesterday, but still significant when moving and breathing deeply. There is tenderness along the anterior chest to the back upon palpation. He says the pain radiates to his shoulder and neck when moving.  ACS was ruled out with negative repeat troponins and no EKG changes last night or this morning. [facility] CXR 03/24/15 showed a low volume chest with a possible patchy basilar lung opacity. Quality was low due to significant splinting. CXR 03/25/15 showed bibasilar infiltrates and small effusions. His left rib x-ray 03/24/15 showed no fracture. CT angiogram  of the chest 03/24/15 was negative for PE, aortic dissection, aortic aneurysm, and coronary calcification. It showed a small left pleural effusion and left lower lobe airspace disease, with mild right lower lobe airspace disease. CT angiogram of the abdomen showed fatty infiltration of the liver, but no renal obstruction or mass. The infiltrates and effusions could be caused by a pneumonia or infectious process, but he does not have cough, fever, or leukocytosis. His ESR was elevated at 45 and CRP was elevated at 9.9, so drug-induced collagen vascular disease should be considered. - Order ANA, anti-histone Ab. - Continue acetaminophen 650 mg tablet PO q6 PRN pain. - Continue cyclobenzaprine 5 mg tablet PO TID PRN muscle spasms. - STOP ketorolac 30 mg/mL IV q6 PRN pain. - Start ketorolac 10 mg PO q6. - Continue incentive spirometry q4 hr while awake.  Hyperlipidemia: His ASCVD 10-year risk score was calculated to be 4.6%. Statin therapy is not indicated at  this time. - STOP pitavistatin. - Routine monitoring with his PCP. Recommend a non-statin lipid lowering drug if indicated in the future.  Mild normocytic anemia: Patient Hg 12.3 with last 15.3 on 09/07/13. He has no active bleeding. Smear showed "atypical lymphocytes." - Obtain smear review. - Needs outpatient screening colonoscopy due to age >61. - Consider anemia panel outpatient.  Diet: Regular DVT Ppx: Lovenox Code: Full   This is a Careers information officer Note.  The care of the patient was discussed with Dr. Marlowe Sax and the assessment and plan formulated with their assistance.  Please see their attached note for official documentation of the daily encounter.     Kreg Shropshire, Med Student 03/25/2015, 10:26 AM

## 2015-03-25 NOTE — Discharge Instructions (Signed)
Take the following medications:  Toradol 10 mg: 1 tablet by mouth every 6 hours as needed for pain and inflammation. Do NOT take the medication after 03/28/15.  Flexeril 5 mg: take 1 tablet by mouth 3 times a day as needed for muscle spasms.  You may get drowsy or dizzy when you first start taking the medicine or change doses. Do not drive, use machinery, or do anything that may be dangerous until you know how the medicine affects you. Stand or sit up slowly.  Apply Voltaren gel to the rib area 4 times a day as directed.   STOP taking Pitavastatin

## 2015-03-25 NOTE — Progress Notes (Signed)
Pt has been discharged home with wife. Pt's IVs was removed with no complications. Pt and pt's wife received discharge instructions and all questions were answered. Pt was instructed to pick up his medications from his pharmacy. Pt left the floor via wheelchair and was accompanied by a nurse tech and his wife. Pt was in no distress at the time of discharge.   Berdine DanceLauren Moffitt RN, BSN

## 2015-03-26 ENCOUNTER — Emergency Department
Admission: EM | Admit: 2015-03-26 | Discharge: 2015-03-26 | Disposition: A | Discharge status: Nursing Home (Includes ICF) | Payer: BLUE CROSS/BLUE SHIELD | Source: Home / Self Care

## 2015-03-26 ENCOUNTER — Observation Stay (HOSPITAL_COMMUNITY)
Admission: EM | Admit: 2015-03-26 | Discharge: 2015-03-27 | Disposition: A | Discharge status: Home / Self Care | Payer: BLUE CROSS/BLUE SHIELD | Attending: Oncology | Admitting: Oncology

## 2015-03-26 ENCOUNTER — Encounter (HOSPITAL_COMMUNITY): Payer: Self-pay | Admitting: *Deleted

## 2015-03-26 ENCOUNTER — Emergency Department (HOSPITAL_COMMUNITY): Payer: BLUE CROSS/BLUE SHIELD

## 2015-03-26 DIAGNOSIS — K259 Gastric ulcer, unspecified as acute or chronic, without hemorrhage or perforation: Secondary | ICD-10-CM | POA: Insufficient documentation

## 2015-03-26 DIAGNOSIS — J129 Viral pneumonia, unspecified: Principal | ICD-10-CM | POA: Insufficient documentation

## 2015-03-26 DIAGNOSIS — J189 Pneumonia, unspecified organism: Secondary | ICD-10-CM

## 2015-03-26 DIAGNOSIS — E785 Hyperlipidemia, unspecified: Secondary | ICD-10-CM | POA: Diagnosis not present

## 2015-03-26 DIAGNOSIS — J9811 Atelectasis: Secondary | ICD-10-CM | POA: Insufficient documentation

## 2015-03-26 DIAGNOSIS — Z880 Allergy status to penicillin: Secondary | ICD-10-CM | POA: Insufficient documentation

## 2015-03-26 DIAGNOSIS — J90 Pleural effusion, not elsewhere classified: Secondary | ICD-10-CM | POA: Insufficient documentation

## 2015-03-26 DIAGNOSIS — R918 Other nonspecific abnormal finding of lung field: Secondary | ICD-10-CM | POA: Diagnosis not present

## 2015-03-26 DIAGNOSIS — R079 Chest pain, unspecified: Secondary | ICD-10-CM | POA: Diagnosis present

## 2015-03-26 DIAGNOSIS — K760 Fatty (change of) liver, not elsewhere classified: Secondary | ICD-10-CM | POA: Diagnosis not present

## 2015-03-26 DIAGNOSIS — Z888 Allergy status to other drugs, medicaments and biological substances status: Secondary | ICD-10-CM | POA: Insufficient documentation

## 2015-03-26 DIAGNOSIS — K219 Gastro-esophageal reflux disease without esophagitis: Secondary | ICD-10-CM | POA: Diagnosis not present

## 2015-03-26 DIAGNOSIS — Z8249 Family history of ischemic heart disease and other diseases of the circulatory system: Secondary | ICD-10-CM | POA: Diagnosis not present

## 2015-03-26 DIAGNOSIS — K5730 Diverticulosis of large intestine without perforation or abscess without bleeding: Secondary | ICD-10-CM | POA: Diagnosis not present

## 2015-03-26 LAB — COMPREHENSIVE METABOLIC PANEL
ALT: 43 U/L (ref 17–63)
ANION GAP: 11 (ref 5–15)
AST: 28 U/L (ref 15–41)
Albumin: 3.3 g/dL — ABNORMAL LOW (ref 3.5–5.0)
Alkaline Phosphatase: 97 U/L (ref 38–126)
BUN: 9 mg/dL (ref 6–20)
CALCIUM: 9.5 mg/dL (ref 8.9–10.3)
CHLORIDE: 106 mmol/L (ref 101–111)
CO2: 26 mmol/L (ref 22–32)
CREATININE: 0.96 mg/dL (ref 0.61–1.24)
Glucose, Bld: 97 mg/dL (ref 65–99)
Potassium: 4 mmol/L (ref 3.5–5.1)
SODIUM: 143 mmol/L (ref 135–145)
Total Bilirubin: 0.5 mg/dL (ref 0.3–1.2)
Total Protein: 7.1 g/dL (ref 6.5–8.1)

## 2015-03-26 LAB — URINALYSIS, ROUTINE W REFLEX MICROSCOPIC
Bilirubin Urine: NEGATIVE
Glucose, UA: NEGATIVE mg/dL
Hgb urine dipstick: NEGATIVE
Ketones, ur: NEGATIVE mg/dL
Leukocytes, UA: NEGATIVE
NITRITE: NEGATIVE
PH: 6 (ref 5.0–8.0)
Protein, ur: NEGATIVE mg/dL
SPECIFIC GRAVITY, URINE: 1.015 (ref 1.005–1.030)

## 2015-03-26 LAB — CBC WITH DIFFERENTIAL/PLATELET
BASOS PCT: 1 %
Basophils Absolute: 0.1 10*3/uL (ref 0.0–0.1)
EOS ABS: 0.4 10*3/uL (ref 0.0–0.7)
Eosinophils Relative: 3 %
HEMATOCRIT: 42.3 % (ref 39.0–52.0)
HEMOGLOBIN: 13.8 g/dL (ref 13.0–17.0)
LYMPHS ABS: 2 10*3/uL (ref 0.7–4.0)
Lymphocytes Relative: 18 %
MCH: 28 pg (ref 26.0–34.0)
MCHC: 32.6 g/dL (ref 30.0–36.0)
MCV: 85.8 fL (ref 78.0–100.0)
MONOS PCT: 7 %
Monocytes Absolute: 0.8 10*3/uL (ref 0.1–1.0)
NEUTROS ABS: 7.9 10*3/uL — AB (ref 1.7–7.7)
NEUTROS PCT: 71 %
Platelets: 216 10*3/uL (ref 150–400)
RBC: 4.93 MIL/uL (ref 4.22–5.81)
RDW: 13 % (ref 11.5–15.5)
WBC: 11.1 10*3/uL — AB (ref 4.0–10.5)

## 2015-03-26 LAB — ANTI-DNA ANTIBODY, DOUBLE-STRANDED

## 2015-03-26 LAB — ANTI-SMITH ANTIBODY: ENA SM Ab Ser-aCnc: <0.2 AI (ref 0.0–0.9)

## 2015-03-26 LAB — I-STAT CG4 LACTIC ACID, ED
LACTIC ACID, VENOUS: 1.21 mmol/L (ref 0.5–2.0)
LACTIC ACID, VENOUS: 1.11 mmol/L (ref 0.5–2.0)

## 2015-03-26 MED ORDER — VANCOMYCIN HCL IN DEXTROSE 1-5 GM/200ML-% IV SOLN
1000.0000 mg | Freq: Three times a day (TID) | INTRAVENOUS | Status: DC
  Administered 2015-03-26: 1000 mg via INTRAVENOUS
  Filled 2015-03-26: qty 200

## 2015-03-26 MED ORDER — SODIUM CHLORIDE 0.9 % IV BOLUS (SEPSIS)
1000.0000 mL | Freq: Once | INTRAVENOUS | Status: AC
  Administered 2015-03-26: 1000 mL via INTRAVENOUS

## 2015-03-26 MED ORDER — DEXTROSE 5 % IV SOLN
2.0000 g | Freq: Once | INTRAVENOUS | Status: AC
  Administered 2015-03-26: 2 g via INTRAVENOUS
  Filled 2015-03-26: qty 2

## 2015-03-26 NOTE — ED Notes (Signed)
The pt is c/o  Fever sob since he was admitted on Wednesday and went home with lung lining inflamation d/d d home today he has had a temp  Last dose of sdvil 1100 am today  toradol 1500

## 2015-03-26 NOTE — Progress Notes (Signed)
Pharmacy Antibiotic Note  Bob LedererJohn S Harris is a 50 y.o. male admitted on 03/26/2015 with pneumonia.  Pharmacy has been consulted for vancomycin dosing. He was just discharged on 3/17 after a stay for pleuritic chest pain. CTA was negative for PE, did show airspace disease. Received 1 time doses of ceftriaxone and azithromycin during that visit, but no other antibiotics.  1 time dose of aztreonam ordered in the ED  Plan: Vancomycin  1000mg  IV every 8 hours.  Goal trough 15-20 mcg/mL.  Follow for other antibiotic orders for broader coverage     Temp (24hrs), Avg:98.6 F (37 C), Min:98.6 F (37 C), Max:98.6 F (37 C)   Recent Labs Lab 03/24/15 0555 03/24/15 0902 03/25/15 0420 03/26/15 1758 03/26/15 1811 03/26/15 2053  WBC 10.8*  --  10.9* 11.1*  --   --   CREATININE 0.94  --  1.03 0.96  --   --   LATICACIDVEN  --  0.90  --   --  1.21 1.11    Estimated Creatinine Clearance: 105.8 mL/min (by C-G formula based on Cr of 0.96).    Allergies  Allergen Reactions  . Rosuvastatin Calcium Other (See Comments)    Myalgia  . Penicillins Hives and Rash    Antimicrobials this admission: vancomycin 3/18 >>   Dose adjustments this admission: n/a  Microbiology results: 3/18 BCx: sent 3/18 UCx: sent   Thank you for allowing pharmacy to be a part of this patient's care.  Bob Harris D. Tamura Lasky, PharmD, BCPS Clinical Pharmacist Pager: [phone] 03/26/2015 9:33 PM

## 2015-03-26 NOTE — ED Notes (Signed)
Pt ambulated with minimal assistance. Initial Sp02 reading was 95%. Sp02 stayed at 95-96% throughout ambulation. Heart rate increased from 100 bpm to 110bpm. Pt's respiratory rate increased significantly at the end of ambulation. RR were 28-30 at end of ambulation when starting out ambulating RR were 18-22. Pt reported being SOB and could speak 4 words between breaths and no full sentences.

## 2015-03-27 ENCOUNTER — Encounter (HOSPITAL_COMMUNITY): Payer: Self-pay | Admitting: *Deleted

## 2015-03-27 DIAGNOSIS — R0781 Pleurodynia: Secondary | ICD-10-CM | POA: Diagnosis not present

## 2015-03-27 DIAGNOSIS — J189 Pneumonia, unspecified organism: Secondary | ICD-10-CM | POA: Diagnosis present

## 2015-03-27 DIAGNOSIS — J9811 Atelectasis: Secondary | ICD-10-CM | POA: Insufficient documentation

## 2015-03-27 DIAGNOSIS — J129 Viral pneumonia, unspecified: Secondary | ICD-10-CM | POA: Insufficient documentation

## 2015-03-27 MED ORDER — METHYLPREDNISOLONE 4 MG PO TBPK: 8.0000 mg | ORAL_TABLET | Freq: Every evening | ORAL | Status: DC

## 2015-03-27 MED ORDER — METHYLPREDNISOLONE 4 MG PO TBPK: 4.0000 mg | ORAL_TABLET | Freq: Four times a day (QID) | ORAL | Status: DC

## 2015-03-27 MED ORDER — ACETAMINOPHEN 325 MG PO TABS
650.0000 mg | ORAL_TABLET | Freq: Four times a day (QID) | ORAL | Status: DC | PRN
  Administered 2015-03-27: 650 mg via ORAL
  Filled 2015-03-27: qty 2

## 2015-03-27 MED ORDER — HYDROCODONE-ACETAMINOPHEN 5-325 MG PO TABS: 1.0000 | ORAL_TABLET | Freq: Four times a day (QID) | ORAL | Status: DC | PRN

## 2015-03-27 MED ORDER — METHYLPREDNISOLONE 4 MG PO TBPK
4.0000 mg | ORAL_TABLET | ORAL | Status: AC
  Administered 2015-03-27: 4 mg via ORAL

## 2015-03-27 MED ORDER — HYDROCODONE-ACETAMINOPHEN 5-325 MG PO TABS
1.0000 | ORAL_TABLET | Freq: Four times a day (QID) | ORAL | Status: AC | PRN
Start: 2015-03-27 — End: ?

## 2015-03-27 MED ORDER — ACETAMINOPHEN 650 MG RE SUPP: 650.0000 mg | Freq: Four times a day (QID) | RECTAL | Status: DC | PRN

## 2015-03-27 MED ORDER — METHYLPREDNISOLONE 4 MG PO TBPK: 4.0000 mg | ORAL_TABLET | Freq: Three times a day (TID) | ORAL | Status: DC

## 2015-03-27 MED ORDER — ENOXAPARIN SODIUM 40 MG/0.4ML ~~LOC~~ SOLN: 40.0000 mg | Freq: Every day | SUBCUTANEOUS | Status: DC

## 2015-03-27 MED ORDER — METHYLPREDNISOLONE 4 MG PO TBPK
8.0000 mg | ORAL_TABLET | Freq: Every morning | ORAL | Status: AC
  Administered 2015-03-27: 8 mg via ORAL
  Filled 2015-03-27: qty 21

## 2015-03-27 MED ORDER — METHYLPREDNISOLONE 4 MG PO TBPK: ORAL_TABLET | ORAL | Status: AC

## 2015-03-27 MED ORDER — METHYLPREDNISOLONE 4 MG PO TBPK: 4.0000 mg | ORAL_TABLET | ORAL | Status: DC

## 2015-03-27 NOTE — Discharge Summary (Signed)
Name: Bob LedererJohn S Harris MRN: [id number] DOB: 05-20-1965 50 y.o. PCP: No primary care provider on file.  Date of Admission: 03/24/2015  5:26 AM Date of Discharge: 03/27/2015 Attending Physician: No att. providers found  Discharge Diagnosis: Principal Problem:   Chest pain Active Problems:   Hyperlipidemia   Gynecomastia   Overweight   Normocytic anemia   GERD (gastroesophageal reflux disease)   History of viral meningitis   History of gastric ulcer   History of nephrolithiasis   Hypocalcemia   Hypokalemia  Discharge Medications:   Medication List    STOP taking these medications        LIVALO 2 MG Tabs  Generic drug:  Pitavastatin Calcium      TAKE these medications        cyclobenzaprine 5 MG tablet  Commonly known as:  FLEXERIL  Take 1 tablet (5 mg total) by mouth 3 (three) times daily as needed for muscle spasms.     diclofenac sodium 1 % Gel  Commonly known as:  VOLTAREN  Apply 2 g topically 4 (four) times daily.     ketorolac 10 MG tablet  Commonly known as:  TORADOL  Take 1 tablet (10 mg total) by mouth every 6 (six) hours.        Disposition and follow-up:   [facility] Good [facility].  At the hospital follow up visit please address:  1.  Pleuritic chest pain   Normocytic anemia   2.  Labs / imaging needed at time of follow-up: Anemia panel, screening colonoscopy   3.  Pending labs/ test needing follow-up: Blood cultures, ANA, Anti-histone antibodies   Follow-up Appointments:     Follow-up Information    [facility] Go on 03/31/2015.   Specialty:  Physician Assistant   Why:  Follow up appointment at 9:45 am.    Contact information:   [address] [place] [address] [phone]       Discharge Instructions:  Take the following medications:  Toradol 10 mg: 1 tablet by mouth every 6 hours as needed for pain and inflammation. Do NOT take the medication  after 03/28/15.  Flexeril 5 mg: take 1 tablet by mouth 3 times a day as needed for muscle spasms.  You may get drowsy or dizzy when you first start taking the medicine or change doses. Do not drive, use machinery, or do anything that may be dangerous until you know how the medicine affects you. Stand or sit up slowly.  Apply Voltaren gel to the rib area 4 times a day as directed.   STOP taking Pitavastatin   Consultations:    Procedures Performed:  Dg Chest 2 View  03/26/2015  CLINICAL DATA:  Pt was admitted on Thursday and discharged yesterday for the same symptoms that he presents to the ED today. Pt having SOB, chest pain on the left side radiating around to his back. Pt states he was told he had inflammation of the lining of the lung. EXAM: CHEST  2 VIEW COMPARISON:  03/25/2015 FINDINGS: There is significant bibasilar opacity, associated with bilateral pleural effusions. The hemidiaphragms are obscured bilaterally. Suspect air bronchograms in the left lower lobe. No evidence for pulmonary edema. Heart size difficult to assess given the opacity at the bases. IMPRESSION: 1. Persistent bilateral lower lobe atelectasis or infiltrate and bilateral pleural effusions. 2. No evidence for pulmonary edema. Electronically Signed   By: Norva PavlovElizabeth  Brown M.D.  On: 03/26/2015 20:08   Dg Chest 2 View  03/25/2015  CLINICAL DATA:  Weakness and shortness of breath. Followup pulmonary infiltrates. EXAM: CHEST  2 VIEW COMPARISON:  Chest x-ray and chest CT 03/24/2015 FINDINGS: The cardiac silhouette, mediastinal an hilar contours are within normal limits and stable. Persistent bibasilar infiltrates and small effusions. No pneumothorax. IMPRESSION: Persistent bibasilar infiltrates and small effusions. Electronically Signed   By: Rudie Meyer M.D.   On: 03/25/2015 08:18   Dg Ribs Unilateral Left  03/24/2015  CLINICAL DATA:  Left-sided chest tenderness EXAM: LEFT RIBS - 2 VIEW COMPARISON:  Chest radiograph and chest  CT March 24, 2015 FINDINGS: Oblique and cone-down lower left rib images were obtained. There is consolidation in the left lung base. There is node appreciable pneumothorax or effusion on the left. No rib fracture identified. IMPRESSION: No rib fracture evident.  Left base consolidation. Electronically Signed   By: Bretta Bang III M.D.   On: 03/24/2015 10:36   Dg Chest Port 1 View  03/24/2015  CLINICAL DATA:  Chest pain radiating to the back. EXAM: PORTABLE CHEST 1 VIEW COMPARISON:  09/07/2013 FINDINGS: Low volume chest with patchy basilar lung opacity. The left-sided opacity appears ill-defined and dense. No definitive cardiomegaly when accounting for technique. Upper mediastinal widening which could be related to volumes. No edema, effusion, or air leak. IMPRESSION: 1. Bibasilar opacity that is primarily concerning for pneumonia, especially on the left. 2. Low volumes. Electronically Signed   By: Marnee Spring M.D.   On: 03/24/2015 06:09   Ct Angio Chest Aorta W/cm &/or Wo/cm  03/24/2015  CLINICAL DATA:  Stabbing chest pain EXAM: CT ANGIOGRAPHY CHEST, ABDOMEN AND PELVIS TECHNIQUE: Multidetector CT imaging through the chest, abdomen and pelvis was performed using the standard protocol during bolus administration of intravenous contrast. Multiplanar reconstructed images and MIPs were obtained and reviewed to evaluate the vascular anatomy. CONTRAST:  OMNIPAQUE IOHEXOL 350 MG/ML SOLN COMPARISON:  Chest x-ray 03/24/2015 FINDINGS: CTA CHEST FINDINGS Unenhanced images reveal no evidence of aortic hematoma. Postcontrast images reveal no evidence of aortic aneurysm or dissection. Heart size normal. No pericardial effusion. Negative for coronary calcification. Negative for pulmonary emboli. Pulmonary arteries normal in caliber. Small left pleural effusion. Left lower lobe airspace disease compatible with pneumonia or atelectasis. Mild right lower lobe airspace disease. Elevated right hemidiaphragm.  Negative for thoracic spine fracture or mass lesion. Review of the MIP images confirms the above findings. CTA ABDOMEN AND PELVIS FINDINGS Negative for aortic aneurysm or dissection. Normal aortic caliber without significant atherosclerotic disease. Negative for retroperitoneal hematoma. Iliac arteries patent bilaterally. Single renal artery bilaterally patent. Celiac and SMA are patent. IMA patent. Fatty infiltration of the liver. 15 mm hypodensity in the caudal aspect of the right lobe liver does not enhance on delayed images. This may represent a cyst. This has water density. Gallbladder and bile ducts normal. Pancreas and spleen normal. Kidneys show symmetric perfusion.  No renal obstruction or mass. Negative for mass or adenopathy.  No free fluid Negative for bowel obstruction. No bowel edema or mass. Mild sigmoid diverticulosis. Negative for diverticulitis. Appendix not well visualized. No evidence of acute appendicitis. No significant skeletal abnormality. Review of the MIP images confirms the above findings. IMPRESSION: Negative for aortic aneurysm or dissection.  No acute abnormality Fatty infiltration   of the liver. Small left pleural effusion. Bibasilar infiltrates, possible pneumonia versus atelectasis. Electronically Signed   By: Marlan Palau M.D.   On: 03/24/2015 08:24   Ct Angio  Abd/pel W/ And/or W/o  03/24/2015  CLINICAL DATA:  Stabbing chest pain EXAM: CT ANGIOGRAPHY CHEST, ABDOMEN AND PELVIS TECHNIQUE: Multidetector CT imaging through the chest, abdomen and pelvis was performed using the standard protocol during bolus administration of intravenous contrast. Multiplanar reconstructed images and MIPs were obtained and reviewed to evaluate the vascular anatomy. CONTRAST:  OMNIPAQUE IOHEXOL 350 MG/ML SOLN COMPARISON:  Chest x-ray 03/24/2015 FINDINGS: CTA CHEST FINDINGS Unenhanced images reveal no evidence of aortic hematoma. Postcontrast images reveal no evidence of aortic aneurysm or  dissection. Heart size normal. No pericardial effusion. Negative for coronary calcification. Negative for pulmonary emboli. Pulmonary arteries normal in caliber. Small left pleural effusion. Left lower lobe airspace disease compatible with pneumonia or atelectasis. Mild right lower lobe airspace disease. Elevated right hemidiaphragm. Negative for thoracic spine fracture or mass lesion. Review of the MIP images confirms the above findings. CTA ABDOMEN AND PELVIS FINDINGS Negative for aortic aneurysm or dissection. Normal aortic caliber without significant atherosclerotic disease. Negative for retroperitoneal hematoma. Iliac arteries patent bilaterally. Single renal artery bilaterally patent. Celiac and SMA are patent. IMA patent. Fatty infiltration of the liver. 15 mm hypodensity in the caudal aspect of the right lobe liver does not enhance on delayed images. This may represent a cyst. This has water density. Gallbladder and bile ducts normal. Pancreas and spleen normal. Kidneys show symmetric perfusion.  No renal obstruction or mass. Negative for mass or adenopathy.  No free fluid Negative for bowel obstruction. No bowel edema or mass. Mild sigmoid diverticulosis. Negative for diverticulitis. Appendix not well visualized. No evidence of acute appendicitis. No significant skeletal abnormality. Review of the MIP images confirms the above findings. IMPRESSION: Negative for aortic aneurysm or dissection.  No acute abnormality Fatty infiltration   of the liver. Small left pleural effusion. Bibasilar infiltrates, possible pneumonia versus atelectasis. Electronically Signed   By: Marlan Palau M.D.   On: 03/24/2015 08:24    Admission HPI: Bob Harris is a pleasant 50 year old man with past medical history of hyperlipidemia, viral meningitis x 2, gastric ulcer, nephrolithiasis, and GERD who presents with chest pain.   He reports feeling in his normal state of health until two days ago when he began having dull left  sided intermittent achy pain that wraps around his left side just under his breast with radiation to his neck while at work. He took OTC ibuprofen with no relief and had to sleep in a recliner due to pain for the past 2 nights. He took old oxycodone he had with mild relief of pain. Last night his pain progressively worsened to >10/10 pain and become more sharp with inability to take a deep breath. His pain is better leaning forward than lying supine. He reports pain with palpation of left side of his chest. He denies prior cardiac history or anginal symptoms. He denies fever, chills, rash, URI symptoms, cough, LE edema/pain, headache, myalgias, palpitations, nausea, vomiting, or change in BM/urination. He reports recent sick contacts at his job and was recently visiting his mother in the hospital. He did not receive flu shot this year. He has never had PE or DVT. He has had chicken pox but never had shingles. He reports having viral meningitis in the 1990's and in 2003. He reports having gastric ulcers and GERD not currently on medical therapy but denies recent acid reflux symptoms. He denies recent injury, trauma, or heavy lifting. He normally does cardio on a stationary bike but denies starting any new exercise regimens. He reports  having similar left sided chest pain in the same area in May of 2016 after starting crestor for hyperlipidemia. His doctor told him he "popped cartilage" and he discontinued the medication on his own. His symptoms resolved after a few months and had exercise cardiac stress testing which he reports was normal (had done in [place]). He recently started pitavastatin two weeks ago. He called his doctor yesterday who recommended he continue taking the medication and start coenzyme Q 10 which he did not take. His father had MI at age 35 with quadruple bypass. He does not smoke and drinks 1 beer every other day. He denies recreational drug use.   He received SL nitroglycerin and  aspirin on route to the ED with no relief of symptoms. He was hypotensive to 87/57 on ED admission thought to be due to nitroglycerin admission. He reports being diaphoretic and feeling that he was going to pass out at that time. He was also found to be hypoxic to 86-88% on RA requiring supplemental oxygen (3L ). 12-lead EKG revealed no ischemic changes and troponin x1 was negative. CTA chest/abdomen/pelvis revealed no PE, aortic dissection, nephrolithiasis, or aneurysm. It did show small left pleural effusion and bibasilar infiltrates suggesting possible atelectasis vs pneumonia. His WBC was 10.8K and was started on ceftriaxone and azithromycin for CAP. He was also started on fentanyl with improvement of pain to 5/10. He still has difficulty taking a deep breath due to pain.    Hospital Course by problem list: Principal Problem:   Chest pain Active Problems:   Hyperlipidemia   Gynecomastia   Overweight   Normocytic anemia   GERD (gastroesophageal reflux disease)   History of viral meningitis   History of gastric ulcer   History of nephrolithiasis   Hypocalcemia   Hypokalemia  Pleuritic Chest pain - Patient presented with 2-day history of severe left sided chest pain with no preceding injury/trauma with tenderness to palpation of chest on exam below breast. Blood pressure was initially low on admission likely related to administration of nitroglycerin en route to the hospital. BP improved with IVFs. 12-lead EKG with no ischemic changes and troponins negative. He had a previous evaluation for atypical chest pain which included a stress test which was negative. CTA chest with no PE or dissection but revealed left lower lobe pulmonary infiltrate with an associated small effusion. White count 10.8 on admission, however, no fever, chills, or cough. Pro-calcitonin was not elevated. Lactic acid level was normal on admission. Blood cultures negative x1 day. Patient reported having previous chest pain in  the same area after starting crestor and was recently started on pitavastatin 2 weeks prior to this admission. He denied having any myalgias or myositis. We considered drug induced lupus with associated pleuritisis as a possible differential. Sed rate and CRP elevated. Anti-DNA and Anti-smith antibodies negative. ANA and Histone antibodies still pending. Lipid panel showing cholesterol 176, Triglycerides 148, HDL 44, and LDL 102. Pitavastatin was stopped. He has a history of gastric ulcer and GERD but no recent acid reflux symptoms. No pericardial frictional rub on exam or EKG findings to suggest pericarditis. No prodrome or rash to suggest shingles. His pain improved with Toradol, Voltaren gel, and Flexeril. Patient had residual pleuritic chest discomfort but did not complain of any dyspnea the the day of discharge. O2 saturation greater than or equal to 93% on room air. Both him and his wife expressed their desire for the patient to leave the hospital because they felt comfortable managing his  residual symptoms at home. He has been scheduled to visit his primary care physician's office for a hospital follow up.   Normocytic anemia - Pt with Hg 12.3, MCV 87.9 on admission. Last Hgb 15.3 on 09/07/13 with no active bleeding.  -Consider anemia panel for further workup as outpatient  -Needs outpatient screening colonoscopy due to age >61  Discharge Vitals:   BP 131/72 mmHg  Pulse 91  Temp(Src) 99.5 F (37.5 C) (Oral)  Resp 20  Ht 6\' 2"  (1.88 m)  Wt 98.068 kg (216 lb 3.2 oz)  BMI 27.75 kg/m2  SpO2 93%  Discharge Labs:  No results found for this or any previous visit (from the past 24 hour(s)).  Signed: Delyle Giovanni, MD 03/27/2015, 7:47 PM    Services Ordered on Discharge: none  Equipment Ordered on Discharge: none

## 2015-03-27 NOTE — H&P (Signed)
Date: 03/27/2015               Patient Name:  Bob Harris MRN: [id number]  DOB: 09/16/1965 Age / Sex: 50 y.o., male   PCP: No primary care provider on file.         Medical Service: Internal Medicine Teaching Service         Attending Physician: Dr. Annia Belt, MD    First Contact: Dr. Berline Lopes Pager: [phone]  Second Contact: Dr. Jacques Earthly Pager: [phone]       After Hours (After 5p/  First Contact Pager: [phone]  weekends / holidays): Second Contact Pager: [phone]   Chief Complaint: Shortness of Breath  History of Present Illness:   [name] is a 50 year old gentleman hyperlipidemia, viral meningitis x 2, gastric ulcer, nephrolithiasis, and GERD who presents with shortness of breath and chest discomfort. The patient was briefly hospitalized (3/16-3/17) for similar symptoms and a left-sided pleural effusion with bibasilar infiltrates. Today, he feels his symptoms are not appreciably different than when he presented on 3/16. He describes dyspnea particularly on exertion and when he lies flat. He also describes chills and a temperature at home of 100.4-100.26F. He briefly felt better the morning of 3/18, but he has recently "lost [his] pep." He also describes some left sided chest pain that wraps around his back and over his shoulder that is worse on inspiration. Because of this, he reports being unable to take a full breath. He says there have been plenty of sick contacts at work, where works as an Buyer, retail. He denies syncope, light-headedness, headache, heart palpitations, nausea, vomiting, diarrhea, confusion, cough, leg swelling. He does not smoke and only occasionally drinks. He denies any international trips and reports regularly negative tuberculin skin tests. He has no family history of autoimmune or clotting disorders. He said the reason for returning to the ED was that he and his wife were following discharge instructions. They went to the urgent care  after noting the fever and were subsequently advised to go to the ED.  During the previous admission, his workup for his chest pain revealed no ischemic etiologies and was deemed most likely pleuritic. CT angio demonstrated no PE, only small left sided pleural effusion with pneumonia versus atelectasis. Procalcitonin was reassuring and there were no systemic signs of infection with only borderline high leukocytosis. ANA, Histone antibody, Anti-DNA antibody, Anti-smith antibody - all being negative except for pending Histone antibody. CRP was high at 9.9, ESR 45. A smear showed atypical lymphocytes.   In the ED patient was satting at 95% on RA, staying 95-96% on ambulation. Notably, the heart rate increased from 100 to 110 with a respiratory rate of 28-30 from a baseline rate of 18-22. CXR showed largely unchanged bilateral lower lobe atelectasis versus infiltrate without pleural effusion. EKG showed normal sinus rhythm. Mild leukocytosis to 11.1 with Neutro Abs 7.9 without fever. Blood cultures were drawn, and the patient was given Vancomycin and Aztreonam for presumed HAP.   Meds: Current Facility-Administered Medications  Medication Dose Route Frequency Provider Last Rate Last Dose  . acetaminophen (TYLENOL) tablet 650 mg  650 mg Oral Q6H PRN Corky Sox, MD   650 mg at 03/27/15 0239   Or  . acetaminophen (TYLENOL) suppository 650 mg  650 mg Rectal Q6H PRN Corky Sox, MD      . enoxaparin (LOVENOX) injection 40 mg  40 mg Subcutaneous Daily Annia Belt, MD  Current Outpatient Prescriptions  Medication Sig Dispense Refill  . cyclobenzaprine (FLEXERIL) 5 MG tablet Take 1 tablet (5 mg total) by mouth 3 (three) times daily as needed for muscle spasms. 15 tablet 0  . diclofenac sodium (VOLTAREN) 1 % GEL Apply 2 g topically 4 (four) times daily. 100 g 0  . ibuprofen (ADVIL,MOTRIN) 200 MG tablet Take 400 mg by mouth every 6 (six) hours as needed for moderate pain.     Marland Kitchen ketorolac  (TORADOL) 10 MG tablet Take 1 tablet (10 mg total) by mouth every 6 (six) hours. 15 tablet 0    Allergies: Allergies as of 03/26/2015 - Review Complete 03/26/2015  Allergen Reaction Noted  . Rosuvastatin calcium Other (See Comments) 03/26/2015  . Penicillins Hives and Rash 12/30/2012   Past Medical History  Diagnosis Date  . Hyperlipidemia    Past Surgical History  Procedure Laterality Date  . Neck surgery    . Shoulder surgery Left   . Tonsillectomy     Family History  Problem Relation Age of Onset  . Hypertension Father   . Heart failure Father    Social History   Social History  . Marital Status: Married    Spouse Name: N/A  . Number of Children: N/A  . Years of Education: N/A   Occupational History  . Not on file.   Social History Main Topics  . Smoking status: Never Smoker   . Smokeless tobacco: Not on file  . Alcohol Use: Yes     Comment: 2 drinks q wk  . Drug Use: No  . Sexual Activity: Not on file   Other Topics Concern  . Not on file   Social History Narrative    Review of Systems: All systems negative except as mentioned in HPI.  Physical Exam: Blood pressure 130/82, pulse 88, temperature 98.6 F (37 C), temperature source Oral, resp. rate 23, SpO2 92 %. General: Lying in bed, NAD HEENT: Neck supple, no JVD, MMM, no tonsillar exudate or erythema, EOMI, no scleral icterus Cardiovascular: RRR, no m/r/g Pulmonary: Fine crackles in the lung bases. Poor respiratory excursions. No wheezes, no egophony Abdominal: Soft, NT/ND. Normal bowel sounds Skin: Warm and dry. No rashes. Extremities: No clubbing, cyanosis, or edema Neurological: Speech normal. Tongue midline, face symmetric Psychiatric: Normal behavior and affect.    Lab results: Basic Metabolic Panel:  Recent Labs  03/24/15 0924 03/24/15 1334 03/25/15 0420 03/26/15 1758  NA  --   --  141 143  K  --   --  4.2 4.0  CL  --   --  106 106  CO2  --   --  28 26  GLUCOSE  --   --  117*  97  BUN  --   --  11 9  CREATININE  --   --  1.03 0.96  CALCIUM  --   --  8.5* 9.5  MG 1.8  --   --   --   PHOS  --  2.8  --   --    Liver Function Tests:  Recent Labs  03/24/15 0924 03/26/15 1758  AST 23 28  ALT 30 43  ALKPHOS 76 97  BILITOT 0.6 0.5  PROT 6.2* 7.1  ALBUMIN 3.3* 3.3*   CBC:  Recent Labs  03/25/15 0420 03/26/15 1758  WBC 10.9* 11.1*  NEUTROABS 7.8* 7.9*  HGB 12.3* 13.8  HCT 38.5* 42.3  MCV 86.9 85.8  PLT 173 216   Cardiac Enzymes:  Recent Labs  03/24/15 1101 03/24/15 1334 03/24/15 1901  CKTOTAL 105  --   --   TROPONINI  --  <0.03 <0.03   CBG:  Recent Labs  03/24/15 0552  GLUCAP 97   Fasting Lipid Panel:  Recent Labs  03/24/15 1334  CHOL 176  HDL 44  LDLCALC 102*  TRIG 148  CHOLHDL 4.0   Coagulation:  Recent Labs  03/24/15 0924  LABPROT 15.0  INR 1.16     Alcohol Level: No results for input(s): ETH in the last 72 hours. Urinalysis:  Recent Labs  03/24/15 0645 03/26/15 1842  COLORURINE YELLOW YELLOW  LABSPEC 1.029 1.015  PHURINE 6.0 6.0  GLUCOSEU NEGATIVE NEGATIVE  HGBUR NEGATIVE NEGATIVE  BILIRUBINUR NEGATIVE NEGATIVE  KETONESUR NEGATIVE NEGATIVE  PROTEINUR NEGATIVE NEGATIVE  NITRITE NEGATIVE NEGATIVE  LEUKOCYTESUR NEGATIVE NEGATIVE    Imaging results:  Dg Chest 2 View  03/26/2015  CLINICAL DATA:  Pt was admitted on Thursday and discharged yesterday for the same symptoms that he presents to the ED today. Pt having SOB, chest pain on the left side radiating around to his back. Pt states he was told he had inflammation of the lining of the lung. EXAM: CHEST  2 VIEW COMPARISON:  03/25/2015 FINDINGS: There is significant bibasilar opacity, associated with bilateral pleural effusions. The hemidiaphragms are obscured bilaterally. Suspect air bronchograms in the left lower lobe. No evidence for pulmonary edema. Heart size difficult to assess given the opacity at the bases. IMPRESSION: 1. Persistent bilateral lower  lobe atelectasis or infiltrate and bilateral pleural effusions. 2. No evidence for pulmonary edema. Electronically Signed   By: Nolon Nations M.D.   On: 03/26/2015 20:08   Dg Chest 2 View  03/25/2015  CLINICAL DATA:  Weakness and shortness of breath. Followup pulmonary infiltrates. EXAM: CHEST  2 VIEW COMPARISON:  Chest x-ray and chest CT 03/24/2015 FINDINGS: The cardiac silhouette, mediastinal an hilar contours are within normal limits and stable. Persistent bibasilar infiltrates and small effusions. No pneumothorax. IMPRESSION: Persistent bibasilar infiltrates and small effusions. Electronically Signed   By: Marijo Sanes M.D.   On: 03/25/2015 08:18    Other results: EKG: Normal sinus rhythem  Assessment & Plan by Problem:   Dyspnea: I have low suspicion for PE at this time given that his symptoms are largely unchanged and previous CTA was reassuring. Previously low procalcitonin and lack of robust signs of infection make bacterial pneumonia less likely. Therefore, we will forego antibiotics at this time. There are no significiant signs of volume overload on exam. Therefore, his mild fever at home and poor respiratory excursions in the setting of a small pleural effusion and other CXR findings makes atelectasis a likely culprit. Workup for drug-induced lupus is ongoing, and may explain the genesis of the pleural effusion. Otherwise, suspicion for malignancy, TB, and CHF are low in light of the patient's history and physical exam - Incentive Spirometry - Oxygen therapy to keep SpO2 >92% - Blood cultures drawn by ED, pending - Histone antibodies pending  Pleuritic Chest Pain: Worse on inspiration - Tylenol prn  DVT Prophylaxis: Lovenox [place] Code Status: Full  Dispo: Disposition is deferred at this time, awaiting improvement of current medical problems. Anticipated discharge in approximately 1-2 day(s).   The patient does not have a current PCP (No primary care provider on file.) and does  not need an [facility] hospital follow-up appointment after discharge.  The patient does not have transportation limitations that hinder transportation to clinic appointments.  Signed: Liberty Handy, MD 03/27/2015,  3:21 AM

## 2015-03-27 NOTE — Discharge Summary (Signed)
Name: Bob Harris MRN: [id number] DOB: Sep 18, 1965 50 y.o. PCP: No primary care provider on file.  Date of Admission: 03/26/2015  6:31 PM Date of Discharge: 03/27/2015 Attending Physician: Annia Belt, MD  Discharge Diagnosis: 1. Chest pain 2. Atelectasis 3. Pneumonia, viral  Discharge Medications:   Medication List    TAKE these medications        cyclobenzaprine 5 MG tablet  Commonly known as:  FLEXERIL  Take 1 tablet (5 mg total) by mouth 3 (three) times daily as needed for muscle spasms.     diclofenac sodium 1 % Gel  Commonly known as:  VOLTAREN  Apply 2 g topically 4 (four) times daily.     HYDROcodone-acetaminophen 5-325 MG tablet  Commonly known as:  NORCO/VICODIN  Take 1 tablet by mouth every 6 (six) hours as needed for severe pain.     ibuprofen 200 MG tablet  Commonly known as:  ADVIL,MOTRIN  Take 400 mg by mouth every 6 (six) hours as needed for moderate pain.     ketorolac 10 MG tablet  Commonly known as:  TORADOL  Take 1 tablet (10 mg total) by mouth every 6 (six) hours.     methylPREDNISolone 4 MG Tbpk tablet  Commonly known as:  MEDROL DOSEPAK  Please take as instructed. The dose pack will be provided for you by your RN.        Disposition and follow-up:   [name] was discharged from [facility] in Stable condition.  At the hospital follow up visit please address:  1.  Pleuritic chest pain, possible statin induced myalgias.  2.  Labs / imaging needed at time of follow-up: None  3.  Pending labs/ test needing follow-up: Blood culture from 3/18, Histone antibodies, ANA  Follow-up Appointments:     Follow-up Information    Follow up with Primary Care.   Why:  As previously scheduled.      Discharge Instructions: Discharge Instructions    Call MD for:  difficulty breathing, headache or visual disturbances    Complete by:  As directed      Call MD for:  persistant dizziness or light-headedness    Complete  by:  As directed      Call MD for:  persistant nausea and vomiting    Complete by:  As directed      Call MD for:  severe uncontrolled pain    Complete by:  As directed      Diet - low sodium heart healthy    Complete by:  As directed      Increase activity slowly    Complete by:  As directed            Consultations: None  Procedures Performed:  Dg Chest 2 View  03/26/2015  CLINICAL DATA:  Pt was admitted on Thursday and discharged yesterday for the same symptoms that he presents to the ED today. Pt having SOB, chest pain on the left side radiating around to his back. Pt states he was told he had inflammation of the lining of the lung. EXAM: CHEST  2 VIEW COMPARISON:  03/25/2015 FINDINGS: There is significant bibasilar opacity, associated with bilateral pleural effusions. The hemidiaphragms are obscured bilaterally. Suspect air bronchograms in the left lower lobe. No evidence for pulmonary edema. Heart size difficult to assess given the opacity at the bases. IMPRESSION: 1. Persistent bilateral lower lobe atelectasis or infiltrate and bilateral pleural effusions. 2. No evidence for pulmonary edema.  Electronically Signed   By: Nolon Nations M.D.   On: 03/26/2015 20:08   Dg Chest 2 View  03/25/2015  CLINICAL DATA:  Weakness and shortness of breath. Followup pulmonary infiltrates. EXAM: CHEST  2 VIEW COMPARISON:  Chest x-ray and chest CT 03/24/2015 FINDINGS: The cardiac silhouette, mediastinal an hilar contours are within normal limits and stable. Persistent bibasilar infiltrates and small effusions. No pneumothorax. IMPRESSION: Persistent bibasilar infiltrates and small effusions. Electronically Signed   By: Marijo Sanes M.D.   On: 03/25/2015 08:18   Dg Ribs Unilateral Left  03/24/2015  CLINICAL DATA:  Left-sided chest tenderness EXAM: LEFT RIBS - 2 VIEW COMPARISON:  Chest radiograph and chest CT March 24, 2015 FINDINGS: Oblique and cone-down lower left rib images were obtained. There is  consolidation in the left lung base. There is node appreciable pneumothorax or effusion on the left. No rib fracture identified. IMPRESSION: No rib fracture evident.  Left base consolidation. Electronically Signed   By: Lowella Grip III M.D.   On: 03/24/2015 10:36   Dg Chest Port 1 View  03/24/2015  CLINICAL DATA:  Chest pain radiating to the back. EXAM: PORTABLE CHEST 1 VIEW COMPARISON:  09/07/2013 FINDINGS: Low volume chest with patchy basilar lung opacity. The left-sided opacity appears ill-defined and dense. No definitive cardiomegaly when accounting for technique. Upper mediastinal widening which could be related to volumes. No edema, effusion, or air leak. IMPRESSION: 1. Bibasilar opacity that is primarily concerning for pneumonia, especially on the left. 2. Low volumes. Electronically Signed   By: Monte Fantasia M.D.   On: 03/24/2015 06:09   Ct Angio Chest Aorta W/cm &/or Wo/cm  03/24/2015  CLINICAL DATA:  Stabbing chest pain EXAM: CT ANGIOGRAPHY CHEST, ABDOMEN AND PELVIS TECHNIQUE: Multidetector CT imaging through the chest, abdomen and pelvis was performed using the standard protocol during bolus administration of intravenous contrast. Multiplanar reconstructed images and MIPs were obtained and reviewed to evaluate the vascular anatomy. CONTRAST:  166m OMNIPAQUE IOHEXOL 350 MG/ML SOLN COMPARISON:  Chest x-ray 03/24/2015 FINDINGS: CTA CHEST FINDINGS Unenhanced images reveal no evidence of aortic hematoma. Postcontrast images reveal no evidence of aortic aneurysm or dissection. Heart size normal. No pericardial effusion. Negative for coronary calcification. Negative for pulmonary emboli. Pulmonary arteries normal in caliber. Small left pleural effusion. Left lower lobe airspace disease compatible with pneumonia or atelectasis. Mild right lower lobe airspace disease. Elevated right hemidiaphragm. Negative for thoracic spine fracture or mass lesion. Review of the MIP images confirms the above  findings. CTA ABDOMEN AND PELVIS FINDINGS Negative for aortic aneurysm or dissection. Normal aortic caliber without significant atherosclerotic disease. Negative for retroperitoneal hematoma. Iliac arteries patent bilaterally. Single renal artery bilaterally patent. Celiac and SMA are patent. IMA patent. Fatty infiltration of the liver. 15 mm hypodensity in the caudal aspect of the right lobe liver does not enhance on delayed images. This may represent a cyst. This has water density. Gallbladder and bile ducts normal. Pancreas and spleen normal. Kidneys show symmetric perfusion.  No renal obstruction or mass. Negative for mass or adenopathy.  No free fluid Negative for bowel obstruction. No bowel edema or mass. Mild sigmoid diverticulosis. Negative for diverticulitis. Appendix not well visualized. No evidence of acute appendicitis. No significant skeletal abnormality. Review of the MIP images confirms the above findings. IMPRESSION: Negative for aortic aneurysm or dissection.  No acute abnormality Fatty infiltration   of the liver. Small left pleural effusion. Bibasilar infiltrates, possible pneumonia versus atelectasis. Electronically Signed   By: CJuanda Crumble  Carlis Abbott M.D.   On: 03/24/2015 08:24   Ct Angio Abd/pel W/ And/or W/o  03/24/2015  CLINICAL DATA:  Stabbing chest pain EXAM: CT ANGIOGRAPHY CHEST, ABDOMEN AND PELVIS TECHNIQUE: Multidetector CT imaging through the chest, abdomen and pelvis was performed using the standard protocol during bolus administration of intravenous contrast. Multiplanar reconstructed images and MIPs were obtained and reviewed to evaluate the vascular anatomy. CONTRAST:  177m OMNIPAQUE IOHEXOL 350 MG/ML SOLN COMPARISON:  Chest x-ray 03/24/2015 FINDINGS: CTA CHEST FINDINGS Unenhanced images reveal no evidence of aortic hematoma. Postcontrast images reveal no evidence of aortic aneurysm or dissection. Heart size normal. No pericardial effusion. Negative for coronary calcification. Negative  for pulmonary emboli. Pulmonary arteries normal in caliber. Small left pleural effusion. Left lower lobe airspace disease compatible with pneumonia or atelectasis. Mild right lower lobe airspace disease. Elevated right hemidiaphragm. Negative for thoracic spine fracture or mass lesion. Review of the MIP images confirms the above findings. CTA ABDOMEN AND PELVIS FINDINGS Negative for aortic aneurysm or dissection. Normal aortic caliber without significant atherosclerotic disease. Negative for retroperitoneal hematoma. Iliac arteries patent bilaterally. Single renal artery bilaterally patent. Celiac and SMA are patent. IMA patent. Fatty infiltration of the liver. 15 mm hypodensity in the caudal aspect of the right lobe liver does not enhance on delayed images. This may represent a cyst. This has water density. Gallbladder and bile ducts normal. Pancreas and spleen normal. Kidneys show symmetric perfusion.  No renal obstruction or mass. Negative for mass or adenopathy.  No free fluid Negative for bowel obstruction. No bowel edema or mass. Mild sigmoid diverticulosis. Negative for diverticulitis. Appendix not well visualized. No evidence of acute appendicitis. No significant skeletal abnormality. Review of the MIP images confirms the above findings. IMPRESSION: Negative for aortic aneurysm or dissection.  No acute abnormality Fatty infiltration   of the liver. Small left pleural effusion. Bibasilar infiltrates, possible pneumonia versus atelectasis. Electronically Signed   By: CFranchot GalloM.D.   On: 03/24/2015 08:24    Admission HPI: 50year old gentleman hyperlipidemia, viral meningitis x 2, gastric ulcer, nephrolithiasis, and GERD who presents with shortness of breath and chest discomfort. The patient was briefly hospitalized (3/16-3/17) for similar symptoms and a left-sided pleural effusion with bibasilar infiltrates. Today, he feels his symptoms are not appreciably different than when he presented on 3/16. He  describes dyspnea particularly on exertion and when he lies flat. He also describes chills and a temperature at home of 100.4-100.23F. He briefly felt better the morning of 3/18, but he has recently "lost [his] pep." He also describes some left sided chest pain that wraps around his back and over his shoulder that is worse on inspiration. Because of this, he reports being unable to take a full breath. He says there have been plenty of sick contacts at work, where works as an aBuyer, retail He denies syncope, light-headedness, headache, heart palpitations, nausea, vomiting, diarrhea, confusion, cough, leg swelling. He does not smoke and only occasionally drinks. He denies any international trips and reports regularly negative tuberculin skin tests. He has no family history of autoimmune or clotting disorders. He said the reason for returning to the ED was that he and his wife were following discharge instructions. They went to the urgent care after noting the fever and were subsequently advised to go to the ED.  During the previous admission, his workup for his chest pain revealed no ischemic etiologies and was deemed most likely pleuritic. CT angio demonstrated no PE, only small left sided  pleural effusion with pneumonia versus atelectasis. Procalcitonin was reassuring and there were no systemic signs of infection with only borderline high leukocytosis. ANA, Histone antibody, Anti-DNA antibody, Anti-smith antibody - all being negative except for pending Histone antibody. CRP was high at 9.9, ESR 45. A smear showed atypical lymphocytes.   In the ED patient was satting at 95% on RA, staying 95-96% on ambulation. Notably, the heart rate increased from 100 to 110 with a respiratory rate of 28-30 from a baseline rate of 18-22. CXR showed largely unchanged bilateral lower lobe atelectasis versus infiltrate without pleural effusion. EKG showed normal sinus rhythm. Mild leukocytosis to 11.1 with Neutro Abs 7.9  without fever. Blood cultures were drawn, and the patient was given Vancomycin and Aztreonam for presumed HAP.   Hospital Course by problem list:   Viral pneumonia, atalectasis, chest pain: Low suspicion for PE at this time given that his symptoms are largely unchanged and previous CTA was reassuring. Previously low procalcitonin. He was admitted for observation. He was provided with an incentive spirometer. He was started on a Medrol Dosepak. He was given Norco 5/347m q6hr prn pain. He remained febrile. He was disharged home with instructions to follow up as previously scheduled.  Discharge Vitals:   BP 139/80 mmHg  Pulse 89  Temp(Src) 99.5 F (37.5 C) (Oral)  Resp 23  Ht 6' 1"  (1.854 m)  Wt 217 lb 9.5 oz (98.7 kg)  BMI 28.71 kg/m2  SpO2 94%  Discharge Labs:  Results for orders placed or performed during the hospital encounter of 03/26/15 (from the past 24 hour(s))  Comprehensive metabolic panel     Status: Abnormal   Collection Time: 03/26/15  5:58 PM  Result Value Ref Range   Sodium 143 135 - 145 mmol/L   Potassium 4.0 3.5 - 5.1 mmol/L   Chloride 106 101 - 111 mmol/L   CO2 26 22 - 32 mmol/L   Glucose, Bld 97 65 - 99 mg/dL   BUN 9 6 - 20 mg/dL   Creatinine, Ser 0.96 0.61 - 1.24 mg/dL   Calcium 9.5 8.9 - 10.3 mg/dL   Total Protein 7.1 6.5 - 8.1 g/dL   Albumin 3.3 (L) 3.5 - 5.0 g/dL   AST 28 15 - 41 U/L   ALT 43 17 - 63 U/L   Alkaline Phosphatase 97 38 - 126 U/L   Total Bilirubin 0.5 0.3 - 1.2 mg/dL   GFR calc non Af Amer >60 >60 mL/min   GFR calc Af Amer >60 >60 mL/min   Anion gap 11 5 - 15  CBC with Differential     Status: Abnormal   Collection Time: 03/26/15  5:58 PM  Result Value Ref Range   WBC 11.1 (H) 4.0 - 10.5 K/uL   RBC 4.93 4.22 - 5.81 MIL/uL   Hemoglobin 13.8 13.0 - 17.0 g/dL   HCT 42.3 39.0 - 52.0 %   MCV 85.8 78.0 - 100.0 fL   MCH 28.0 26.0 - 34.0 pg   MCHC 32.6 30.0 - 36.0 g/dL   RDW 13.0 11.5 - 15.5 %   Platelets 216 150 - 400 K/uL   Neutrophils  Relative % 71 %   Neutro Abs 7.9 (H) 1.7 - 7.7 K/uL   Lymphocytes Relative 18 %   Lymphs Abs 2.0 0.7 - 4.0 K/uL   Monocytes Relative 7 %   Monocytes Absolute 0.8 0.1 - 1.0 K/uL   Eosinophils Relative 3 %   Eosinophils Absolute 0.4 0.0 - 0.7 K/uL  Basophils Relative 1 %   Basophils Absolute 0.1 0.0 - 0.1 K/uL  I-Stat CG4 Lactic Acid, ED (Not at [facility])     Status: None   Collection Time: 03/26/15  6:11 PM  Result Value Ref Range   Lactic Acid, Venous 1.21 0.5 - 2.0 mmol/L  Urinalysis, Routine w reflex microscopic (not at [facility])     Status: None   Collection Time: 03/26/15  6:42 PM  Result Value Ref Range   Color, Urine YELLOW YELLOW   APPearance CLEAR CLEAR   Specific Gravity, Urine 1.015 1.005 - 1.030   pH 6.0 5.0 - 8.0   Glucose, UA NEGATIVE NEGATIVE mg/dL   Hgb urine dipstick NEGATIVE NEGATIVE   Bilirubin Urine NEGATIVE NEGATIVE   Ketones, ur NEGATIVE NEGATIVE mg/dL   Protein, ur NEGATIVE NEGATIVE mg/dL   Nitrite NEGATIVE NEGATIVE   Leukocytes, UA NEGATIVE NEGATIVE  Urine culture     Status: None (Preliminary result)   Collection Time: 03/26/15  6:42 PM  Result Value Ref Range   Specimen Description URINE, RANDOM    Special Requests NONE    Culture NO GROWTH < 24 HOURS    Report Status PENDING   I-Stat CG4 Lactic Acid, ED (Not at [facility])     Status: None   Collection Time: 03/26/15  8:53 PM  Result Value Ref Range   Lactic Acid, Venous 1.11 0.5 - 2.0 mmol/L    Signed: Milagros Loll, MD 03/27/2015, 12:59 PM    Services Ordered on Discharge: None Equipment Ordered on Discharge: None

## 2015-03-27 NOTE — Progress Notes (Signed)
Subjective: No acute events overnight. Bob Harris had a temperature of 100.7 F at home and was told by urgent care to come to the ED yesterday. He states his pleuritic chest pain has decreased since discharge on 03/25/15. He still has dyspnea on exertion and is unable to take a deep breath, although he says that is improving as well. He denies any new chest pain or dizziness. He wishes to go home at this time.  Objective: Vital signs in last 24 hours: Filed Vitals:   03/27/15 0245 03/27/15 0300 03/27/15 0315 03/27/15 0404  BP: 140/84 135/84 130/82 139/80  Pulse: 94 89 88 89  Temp:    99.5 F (37.5 C)  TempSrc:    Oral  Resp: Height:     (1.854 m)  Weight:    98.7 kg (217 lb 9.5 oz)  SpO2: 95% 92% 92% 94%    Physical exam not performed. Patient looks well-nourished, in no acute distress. He is taking shallow breaths.   Lab Results: Basic Metabolic Panel:  Recent Labs Lab 03/24/15 0924 03/24/15 1334 03/25/15 0420 03/26/15 1758  NA  --   --  141 143  K  --   --  4.2 4.0  CL  --   --  106 106  CO2  --   --  28 26  GLUCOSE  --   --  117* 97  BUN  --   --  11 9  CREATININE  --   --  1.03 0.96  CALCIUM  --   --  8.5* 9.5  MG 1.8  --   --   --   PHOS  --  2.8  --   --    Liver Function Tests:  Recent Labs Lab 03/24/15 0924 03/26/15 1758  AST 23 28  ALT 30 43  ALKPHOS 76 97  BILITOT 0.6 0.5  PROT 6.2* 7.1  ALBUMIN 3.3* 3.3*   CBC:  Recent Labs Lab 03/25/15 0420 03/26/15 1758  WBC 10.9* 11.1*  NEUTROABS 7.8* 7.9*  HGB 12.3* 13.8  HCT 38.5* 42.3  MCV 86.9 85.8  PLT 173 216   Urinalysis:  Recent Labs Lab 03/24/15 0645 03/26/15 1842  COLORURINE YELLOW YELLOW  LABSPEC 1.029 1.015  PHURINE 6.0 6.0  GLUCOSEU NEGATIVE NEGATIVE  HGBUR NEGATIVE NEGATIVE  BILIRUBINUR NEGATIVE NEGATIVE  KETONESUR NEGATIVE NEGATIVE  PROTEINUR NEGATIVE NEGATIVE  NITRITE NEGATIVE NEGATIVE  LEUKOCYTESUR NEGATIVE NEGATIVE    Studies/Results: Dg Chest 2  View  03/26/2015  CLINICAL DATA:  Pt was admitted on Thursday and discharged yesterday for the same symptoms that he presents to the ED today. Pt having SOB, chest pain on the left side radiating around to his back. Pt states he was told he had inflammation of the lining of the lung. EXAM: CHEST  2 VIEW COMPARISON:  03/25/2015 FINDINGS: There is significant bibasilar opacity, associated with bilateral pleural effusions. The hemidiaphragms are obscured bilaterally. Suspect air bronchograms in the left lower lobe. No evidence for pulmonary edema. Heart size difficult to assess given the opacity at the bases. IMPRESSION: 1. Persistent bilateral lower lobe atelectasis or infiltrate and bilateral pleural effusions. 2. No evidence for pulmonary edema. Electronically Signed   By: Norva Pavlov M.D.   On: 03/26/2015 20:08   Medications: I have reviewed the patient's current medications. Scheduled Meds: . enoxaparin (LOVENOX) injection  40 mg Subcutaneous Daily  . methylPREDNISolone  4 mg Oral PC lunch  . methylPREDNISolone  4 mg  Oral PC supper  . [START ON 03/28/2015] methylPREDNISolone  4 mg Oral 3 x daily with food  . [START ON 03/29/2015] methylPREDNISolone  4 mg Oral 4X daily taper  . methylPREDNISolone  8 mg Oral AC breakfast  . methylPREDNISolone  8 mg Oral Nightly  . [START ON 03/28/2015] methylPREDNISolone  8 mg Oral Nightly   PRN Meds:.acetaminophen **OR** acetaminophen  Assessment/Plan:  Pleuritic chest pain: Bob Harris was admitted to our service with pleuritic chest pain on 03/24/15. CXR and CT angiogram chest showed a small pleural effusion in the left lung. His WBC was 10.9 and procalcitonin was negative. He was discharged 03/25/15 with Toradol 10 mg tablet q6 until 03/29/15, Flexeril 5 mg TID PRN muscle spasms, and Votaren 1% gel QID. Per discharge instructions, Bob Harris called urgent care when his temperature was higher than 100.4 and urgent care advised he return to the ED. ED wished to admit  him due to tachypnea, dyspnea, and tachycardia upon ambulation. His WBC on current admission was 11.1. His CXR from 03/26/15 was unchanged from previous admission. We do not suspect bacterial pneumonia. - Begin methylprednisolone (Medrol dosepak) with taper. - Continue incentive spirometry at home.  Diet: Regular DVT Ppx: Lovenox Code: Full  Dispo: Patient is ready for discharge today.  This is a Psychologist, occupationalMedical Student Note.  The care of the patient was discussed with Dr. Isabella BowensKrall and the assessment and plan formulated with their assistance.  Please see their attached note for official documentation of the daily encounter.     Marquis Lunchatherine Y Kashtyn Jankowski, Med Student 03/27/2015, 10:49 AM

## 2015-03-27 NOTE — Discharge Instructions (Signed)
Please complete the Medrol Dosepak (methylPREDNISolone 4 MG) as instructed on the package. GET HELP IF:  You have a fever of 101. GET HELP RIGHT AWAY IF:  You having worse shortness of breath  You have more chest pain.  Your sickness gets worse. This is very serious if:  You are an older adult.  Your body's defense system is weak.  You cough up blood.   This information is not intended to replace advice given to you by your health care provider. Make sure you discuss any questions you have with your health care provider.   Document Released: 06/13/2007 Document Revised: 09/15/2014 Document Reviewed: 04/21/2014 Elsevier Interactive Patient Education [employer]2016 [employer]

## 2015-03-27 NOTE — ED Provider Notes (Signed)
CSN: [id number]     Arrival date & time 03/26/15  1717 History   First MD Initiated Contact with Patient 03/26/15 1844     Chief Complaint  Patient presents with  . Fever     (Consider location/radiation/quality/duration/timing/severity/associated sxs/prior Treatment) HPI Patient presents to the emergency department with worsening shortness of breath and cough with fever.  The patient was recently admitted to the hospital for what appeared to be a pneumonia, but there was discharge with the diagnosis of pleurisy.  Patient states his symptoms and got worse over the last day.  He states he has worsening shortness of breath especially with exertion.  Patient states he is not sent home on any antibiotics.  He was sent home on Toradol and Flexeril.  Patient states that he did not take any other medications.  The patient denies headache,blurred vision, neck pain, numbness, dizziness, anorexia, edema, abdominal pain, nausea, vomiting, diarrhea, rash, back pain, dysuria, hematemesis, bloody stool, near syncope, or syncope. Oswald Hillock Past Medical History  Diagnosis Date  . Hyperlipidemia    Past Surgical History  Procedure Laterality Date  . Neck surgery    . Shoulder surgery Left   . Tonsillectomy     Family History  Problem Relation Age of Onset  . Hypertension Father   . Heart failure Father    Social History  Substance Use Topics  . Smoking status: Never Smoker   . Smokeless tobacco: None  . Alcohol Use: Yes     Comment: 2 drinks q wk    Review of Systems  All other systems negative except as documented in the HPI. All pertinent positives and negatives as reviewed in the HPI.  Allergies  Rosuvastatin calcium and Penicillins  Home Medications   Prior to Admission medications   Medication Sig Start Date End Date Taking? Authorizing Provider  cyclobenzaprine (FLEXERIL) 5 MG tablet Take 1 tablet (5 mg total) by mouth 3 (three) times daily as needed for muscle spasms. 03/25/15  Yes  Wilgus Giovanni, MD  diclofenac sodium (VOLTAREN) 1 % GEL Apply 2 g topically 4 (four) times daily. 03/25/15  Yes Kendryck Giovanni, MD  ibuprofen (ADVIL,MOTRIN) 200 MG tablet Take 400 mg by mouth every 6 (six) hours as needed for moderate pain.    Yes Historical Provider, MD  ketorolac (TORADOL) 10 MG tablet Take 1 tablet (10 mg total) by mouth every 6 (six) hours. 03/25/15 03/28/15 Yes Juno Giovanni, MD   BP 138/90 mmHg  Pulse 92  Temp(Src) 98.6 F (37 C) (Oral)  Resp 24  SpO2 94% Physical Exam  Constitutional: He is oriented to person, place, and time. He appears well-developed and well-nourished. No distress.  HENT:  Head: Normocephalic and atraumatic.  Mouth/Throat: Oropharynx is clear and moist.  Eyes: Pupils are equal, round, and reactive to light.  Neck: Normal range of motion. Neck supple.  Cardiovascular: Normal rate, regular rhythm and normal heart sounds.  Exam reveals no gallop and no friction rub.   No murmur heard. Pulmonary/Chest: Effort normal and breath sounds normal. No respiratory distress. He has no wheezes.  Abdominal: Soft. Bowel sounds are normal. He exhibits no distension. There is no tenderness.  Neurological: He is alert and oriented to person, place, and time. He exhibits normal muscle tone. Coordination normal.  Skin: Skin is warm and dry. No rash noted. No erythema.  Psychiatric: He has a normal mood and affect. His behavior is normal.  Nursing note and vitals reviewed.   ED Course  Procedures (  including critical care time) Labs Review Labs Reviewed  COMPREHENSIVE METABOLIC PANEL - Abnormal; Notable for the following:    Albumin 3.3 (*)    All other components within normal limits  CBC WITH DIFFERENTIAL/PLATELET - Abnormal; Notable for the following:    WBC 11.1 (*)    Neutro Abs 7.9 (*)    All other components within normal limits  CULTURE, BLOOD (ROUTINE X 2)  CULTURE, BLOOD (ROUTINE X 2)  URINE CULTURE  URINALYSIS, ROUTINE W REFLEX  MICROSCOPIC (NOT AT [facility])  I-STAT CG4 LACTIC ACID, ED  I-STAT CG4 LACTIC ACID, ED    Imaging Review Dg Chest 2 View  03/26/2015  CLINICAL DATA:  Pt was admitted on Thursday and discharged yesterday for the same symptoms that he presents to the ED today. Pt having SOB, chest pain on the left side radiating around to his back. Pt states he was told he had inflammation of the lining of the lung. EXAM: CHEST  2 VIEW COMPARISON:  03/25/2015 FINDINGS: There is significant bibasilar opacity, associated with bilateral pleural effusions. The hemidiaphragms are obscured bilaterally. Suspect air bronchograms in the left lower lobe. No evidence for pulmonary edema. Heart size difficult to assess given the opacity at the bases. IMPRESSION: 1. Persistent bilateral lower lobe atelectasis or infiltrate and bilateral pleural effusions. 2. No evidence for pulmonary edema. Electronically Signed   By: Norva PavlovElizabeth  Brown M.D.   On: 03/26/2015 20:08   Dg Chest 2 View  03/25/2015  CLINICAL DATA:  Weakness and shortness of breath. Followup pulmonary infiltrates. EXAM: CHEST  2 VIEW COMPARISON:  Chest x-ray and chest CT 03/24/2015 FINDINGS: The cardiac silhouette, mediastinal an hilar contours are within normal limits and stable. Persistent bibasilar infiltrates and small effusions. No pneumothorax. IMPRESSION: Persistent bibasilar infiltrates and small effusions. Electronically Signed   By: Rudie MeyerP.  Gallerani M.D.   On: 03/25/2015 08:18   I have personally reviewed and evaluated these images and lab results as part of my medical decision-making.   EKG Interpretation None     .  I reviewed the patient's imaging from his recent visits to the emergency department and in the hospital.  Each one mention infiltrate and also mentioned bilateral pleural effusions.  I feel that he needs admission and IV antibiotics.  He also has a white blood cell count that is elevated.  The patient is advised plan.  All questions were answered.  Spoke  with the internal medicine residents about evaluation and admission   Charlestine NightChristopher Rashaun Wichert, PA-C 03/28/15 0148  Vanetta MuldersScott Zackowski, MD 03/30/15 [phone]

## 2015-03-27 NOTE — Progress Notes (Signed)
Patient ID: Bob Harris, male   DOB: 10/19/1965, 49 y.o.   MRN: [id number] Medicine attending discharge note: I personally examined this patient on the day of discharge and I tested the accuracy of the evaluation and discharge plan as outlined in the progress notes by resident physician Dr. Vasundhra Rathore dated 03/25/2015.  Clinical summary: 49-year-old man who is in overall excellent health without any major medical or surgical illness. There is a family history of early coronary disease in his father who had an MI in his mid 50s. The patient has been on statin drugs in the past to decrease his cardiac risk. He has no other coronary risk factors. He is a never smoker. Normotensive. He noted when he was on a statin drug in the past he got diffuse muscle cramps and had previous episodes where he had localized lower chest pain. He discontinued medication for about 6 months. 2 weeks prior to this admission he was put back on an alternative statin. He had a previous evaluation including a stress test for atypical chest pain which was negative. He  presented with a 2 day history of acute onset of pleuritic left lower chest pain radiating in a dermatomal distribution to his left flank and back. He has had no infectious exposures. He denied any recent acute viral or bacterial syndrome. There is no prior history of thrombosis. He did get increasingly dyspneic as pleuritic chest pain increased and presented to the emergency department where a CT angiogram was done to exclude pulmonary embolus. Study was negative for emboli but did show a left lower lobe pulmonary infiltrate with an associated small effusion.  Hospital course: Blood pressure was low on presentation likely related to administration of nitroglycerin en route to the hospital. Lactic acid not elevated. Pro-calcitonin not elevated. Initial white blood count elevated 10,800 with 70% neutrophils and 16% lymphocytes. Hemoglobin decreased from previously  recorded baseline of 16 g in July 2010 with current value 12, MCV 87. Normal platelet count. Normal liver functions. ESR elevated 45 mm-nonspecific. No acute EKG changes to suggest pericarditis. No skin rash or blistering to suggest early herpes zoster infection. There were no signs for bacterial sepsis. No obvious viral prodrome symptoms. We considered the possibility that he had an atypical lupus-like drug reaction to recently started statin. Preliminary labs with undetectable anti-DNA antibodies or anti-Smith antibodies. ANA and antihistone antibodies pending. The patient reported intolerance to nonsteroidal anti-inflammatories. He was treated primarily with observation and Tylenol. Symptoms improved and both he and his wife felt that they could manage residual symptoms at home.  Disposition: Condition stable at time of discharge. He has residual pleuritic chest discomfort. Oxygen saturation greater than or equal to 93% on room air at rest. Follow-up will be arranged with his primary care physicians. There were no complications  Main diagnosis: Idiopathic left pleuritis, rule out drug induced lupus ancillary studies outstanding 

## 2015-03-27 NOTE — Progress Notes (Signed)
Subjective: No acute events overnight. He feels better overall. Pain improved.  Objective: Vital signs in last 24 hours: Filed Vitals:   03/27/15 0245 03/27/15 0300 03/27/15 0315 03/27/15 0404  BP: 140/84 135/84 130/82 139/80  Pulse: 94 89 88 89  Temp:    99.5 F (37.5 C)  TempSrc:    Oral  Resp: Height:     (1.854 m)  Weight:    217 lb 9.5 oz (98.7 kg)  SpO2: 95% 92% 92% 94%   Weight change:   Intake/Output Summary (Last 24 hours) at 03/27/15 1149 Last data filed at 03/27/15 0404  Gross per 24 hour  Intake    240 ml  Output      0 ml  Net    240 ml   General Apperance: NAD HEENT: Normocephalic, atraumatic, anicteric sclera Neck: Supple, trachea midline Lungs: Few rales bilateral bases.  Heart: Regular rate and rhythm, no murmur/rub/gallop Abdomen: Soft, nontender, nondistended, no rebound/guarding Extremities: Warm and well perfused, no edema Skin: No rashes or lesions Neurologic: Alert and interactive. No gross deficits.  Lab Results: Basic Metabolic Panel:  Recent Labs Lab 03/24/15 0924 03/24/15 1334 03/25/15 0420 03/26/15 1758  NA  --   --  141 143  K  --   --  4.2 4.0  CL  --   --  106 106  CO2  --   --  28 26  GLUCOSE  --   --  117* 97  BUN  --   --  11 9  CREATININE  --   --  1.03 0.96  CALCIUM  --   --  8.5* 9.5  MG 1.8  --   --   --   PHOS  --  2.8  --   --    Liver Function Tests:  Recent Labs Lab 03/24/15 0924 03/26/15 1758  AST 23 28  ALT 30 43  ALKPHOS 76 97  BILITOT 0.6 0.5  PROT 6.2* 7.1  ALBUMIN 3.3* 3.3*   CBC:  Recent Labs Lab 03/25/15 0420 03/26/15 1758  WBC 10.9* 11.1*  NEUTROABS 7.8* 7.9*  HGB 12.3* 13.8  HCT 38.5* 42.3  MCV 86.9 85.8  PLT 173 216   Cardiac Enzymes:  Recent Labs Lab 03/24/15 1101 03/24/15 1334 03/24/15 1901  CKTOTAL 105  --   --   TROPONINI  --  <0.03 <0.03   CBG:  Recent Labs Lab 03/24/15 0552  GLUCAP 97   Fasting Lipid Panel:  Recent Labs Lab  03/24/15 1334  CHOL 176  HDL 44  LDLCALC 102*  TRIG 148  CHOLHDL 4.0   Coagulation:  Recent Labs Lab 03/24/15 0924  LABPROT 15.0  INR 1.16   Urine Drug Screen: Drugs of Abuse     Component Value Date/Time   LABOPIA POSITIVE* 03/24/2015 1031   COCAINSCRNUR NONE DETECTED 03/24/2015 1031   LABBENZ NONE DETECTED 03/24/2015 1031   AMPHETMU NONE DETECTED 03/24/2015 1031   THCU NONE DETECTED 03/24/2015 1031   LABBARB NONE DETECTED 03/24/2015 1031    Urinalysis:  Recent Labs Lab 03/24/15 0645 03/26/15 1842  COLORURINE YELLOW YELLOW  LABSPEC 1.029 1.015  PHURINE 6.0 6.0  GLUCOSEU NEGATIVE NEGATIVE  HGBUR NEGATIVE NEGATIVE  BILIRUBINUR NEGATIVE NEGATIVE  KETONESUR NEGATIVE NEGATIVE  PROTEINUR NEGATIVE NEGATIVE  NITRITE NEGATIVE NEGATIVE  LEUKOCYTESUR NEGATIVE NEGATIVE   Micro Results: Recent Results (from the past 240 hour(s))  Urine culture     Status: None   Collection  Time: 03/24/15  6:45 AM  Result Value Ref Range Status   Specimen Description URINE, CLEAN CATCH  Final   Special Requests NONE  Final   Culture 1,000 COLONIES/mL INSIGNIFICANT GROWTH  Final   Report Status 03/25/2015 FINAL  Final  Blood Culture (routine x 2)     Status: None (Preliminary result)   Collection Time: 03/24/15  8:49 AM  Result Value Ref Range Status   Specimen Description BLOOD RIGHT ANTECUBITAL  Final   Special Requests BOTTLES DRAWN AEROBIC AND ANAEROBIC 5CC  Final   Culture NO GROWTH 2 DAYS  Final   Report Status PENDING  Incomplete  Blood Culture (routine x 2)     Status: None (Preliminary result)   Collection Time: 03/24/15  8:55 AM  Result Value Ref Range Status   Specimen Description BLOOD RIGHT HAND  Final   Special Requests BOTTLES DRAWN AEROBIC AND ANAEROBIC 5CC  Final   Culture NO GROWTH 2 DAYS  Final   Report Status PENDING  Incomplete   Studies/Results: Dg Chest 2 View  03/26/2015  CLINICAL DATA:  Pt was admitted on Thursday and discharged yesterday for the same  symptoms that he presents to the ED today. Pt having SOB, chest pain on the left side radiating around to his back. Pt states he was told he had inflammation of the lining of the lung. EXAM: CHEST  2 VIEW COMPARISON:  03/25/2015 FINDINGS: There is significant bibasilar opacity, associated with bilateral pleural effusions. The hemidiaphragms are obscured bilaterally. Suspect air bronchograms in the left lower lobe. No evidence for pulmonary edema. Heart size difficult to assess given the opacity at the bases. IMPRESSION: 1. Persistent bilateral lower lobe atelectasis or infiltrate and bilateral pleural effusions. 2. No evidence for pulmonary edema. Electronically Signed   By: Norva PavlovElizabeth  Brown M.D.   On: 03/26/2015 20:08   Medications: I have reviewed the patient's current medications. Scheduled Meds: . enoxaparin (LOVENOX) injection  40 mg Subcutaneous Daily  . methylPREDNISolone  4 mg Oral PC lunch  . methylPREDNISolone  4 mg Oral PC supper  . [START ON 03/28/2015] methylPREDNISolone  4 mg Oral 3 x daily with food  . [START ON 03/29/2015] methylPREDNISolone  4 mg Oral 4X daily taper  . methylPREDNISolone  8 mg Oral AC breakfast  . methylPREDNISolone  8 mg Oral Nightly  . [START ON 03/28/2015] methylPREDNISolone  8 mg Oral Nightly   Continuous Infusions:  PRN Meds:.acetaminophen **OR** acetaminophen Assessment/Plan: 50 year old man hyperlipidemia, viral meningitis x 2, gastric ulcer, nephrolithiasis, and GERD who presents with shortness of breath and chest discomfort.  Viral pneumonia: Previously low procalcitonin making bacterial pneumonia less likely. His mild fever at home and poor respiratory excursions in the setting of a small pleural effusion and other CXR findings makes atelectasis a likely culprit.  - Incentive Spirometry - f/u Blood cultures  - Histone antibodies pending - Medrol Dosepak - Norco 5/325 1 tablet q6hr prn pain  DVT Prophylaxis: Lovenox [place] Code Status: Full  Dispo:  Likely home today  The patient does have a current PCP (No primary care provider on file.) and [facility] after discharge.  The patient does not have transportation limitations that hinder transportation to clinic appointments.  .Services Needed at time of discharge: Y = Yes, Blank = No PT:   OT:   RN:   Equipment:   Other:       Lora PaulaJennifer T Seidy Labreck, MD 03/27/2015, 11:49 AM

## 2015-03-28 LAB — ANTINUCLEAR ANTIBODIES, IFA: ANTINUCLEAR ANTIBODIES, IFA: NEGATIVE

## 2015-03-28 LAB — URINE CULTURE: CULTURE: NO GROWTH

## 2015-03-29 LAB — CULTURE, BLOOD (ROUTINE X 2)
Culture: NO GROWTH
Culture: NO GROWTH

## 2015-03-30 LAB — HISTONE ANTIBODIES, IGG, BLOOD: DNA-HISTONE: 0.2 U (ref 0.0–0.9)

## 2015-03-31 ENCOUNTER — Telehealth: Payer: Self-pay | Admitting: *Deleted

## 2015-03-31 LAB — CULTURE, BLOOD (ROUTINE X 2)
CULTURE: NO GROWTH
Culture: NO GROWTH

## 2015-03-31 NOTE — Telephone Encounter (Signed)
-----   Message from Levert FeinsteinJames M Granfortuna, MD sent at 03/30/2015  3:49 PM EDT ----- Call pt: tests to look for medication related lupus done in the hospital all came back negative

## 2015-03-31 NOTE — Telephone Encounter (Signed)
Pt called / informed "tests to look for medication related lupus done in the hospital all came back negative" per Dr Cyndie ChimeGranfortuna.

## 2017-05-03 IMAGING — DX DG CHEST 1V PORT
1 series · 1 of 1 positions shown · non-contrast
Comparison: 09/07/2013

CLINICAL DATA: Chest pain radiating to the back.

EXAM:
PORTABLE CHEST 1 VIEW

[chest ap]
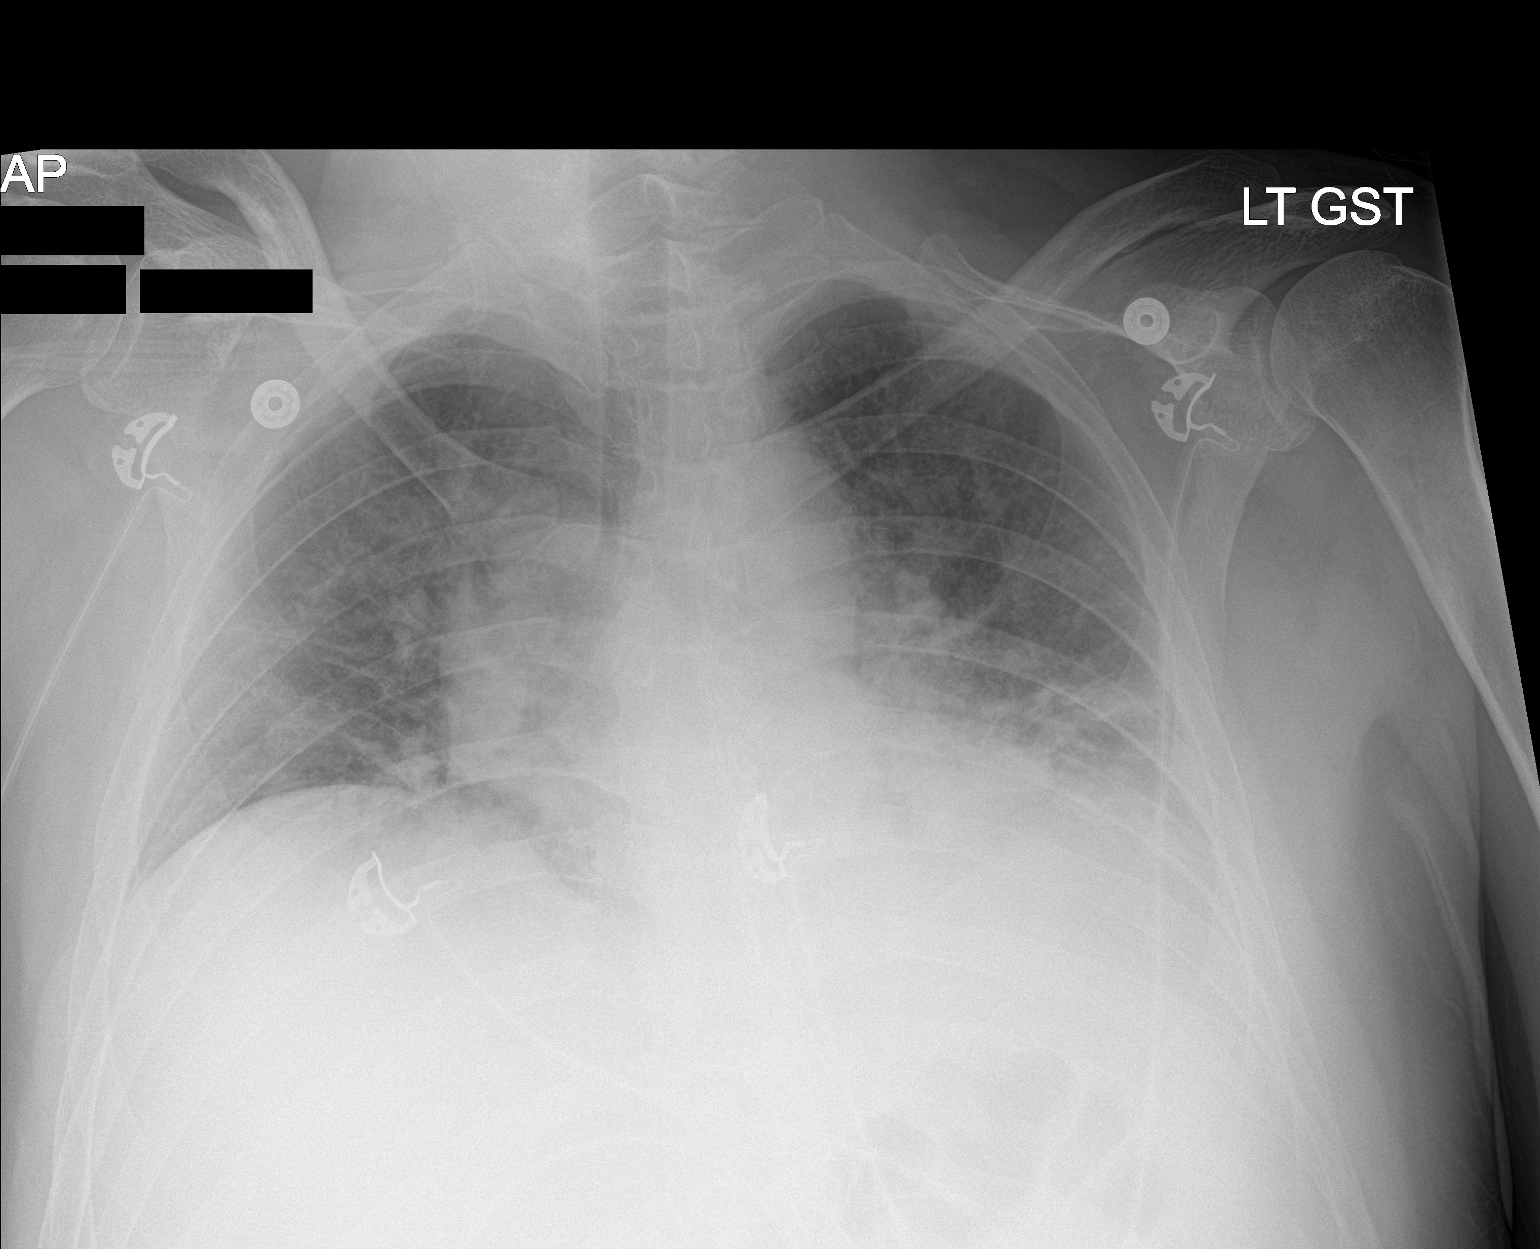

[1 of 1 positions shown; findings below may reference images not displayed]

FINDINGS: Low volume chest with patchy basilar lung opacity. The left-sided
opacity appears ill-defined and dense. No definitive cardiomegaly
when accounting for technique. Upper mediastinal widening which
could be related to volumes. No edema, effusion, or air leak.
IMPRESSION: 1. Bibasilar opacity that is primarily concerning for pneumonia,
especially on the left.
2. Low volumes.

## 2017-05-03 IMAGING — DX DG RIBS 2V*L*
2 series · 2 of 2 positions shown · non-contrast
Comparison: Chest radiograph and chest CT March 24, 2015

CLINICAL DATA: Left-sided chest tenderness

EXAM:
LEFT RIBS - 2 VIEW

[w ribs ap lower left]
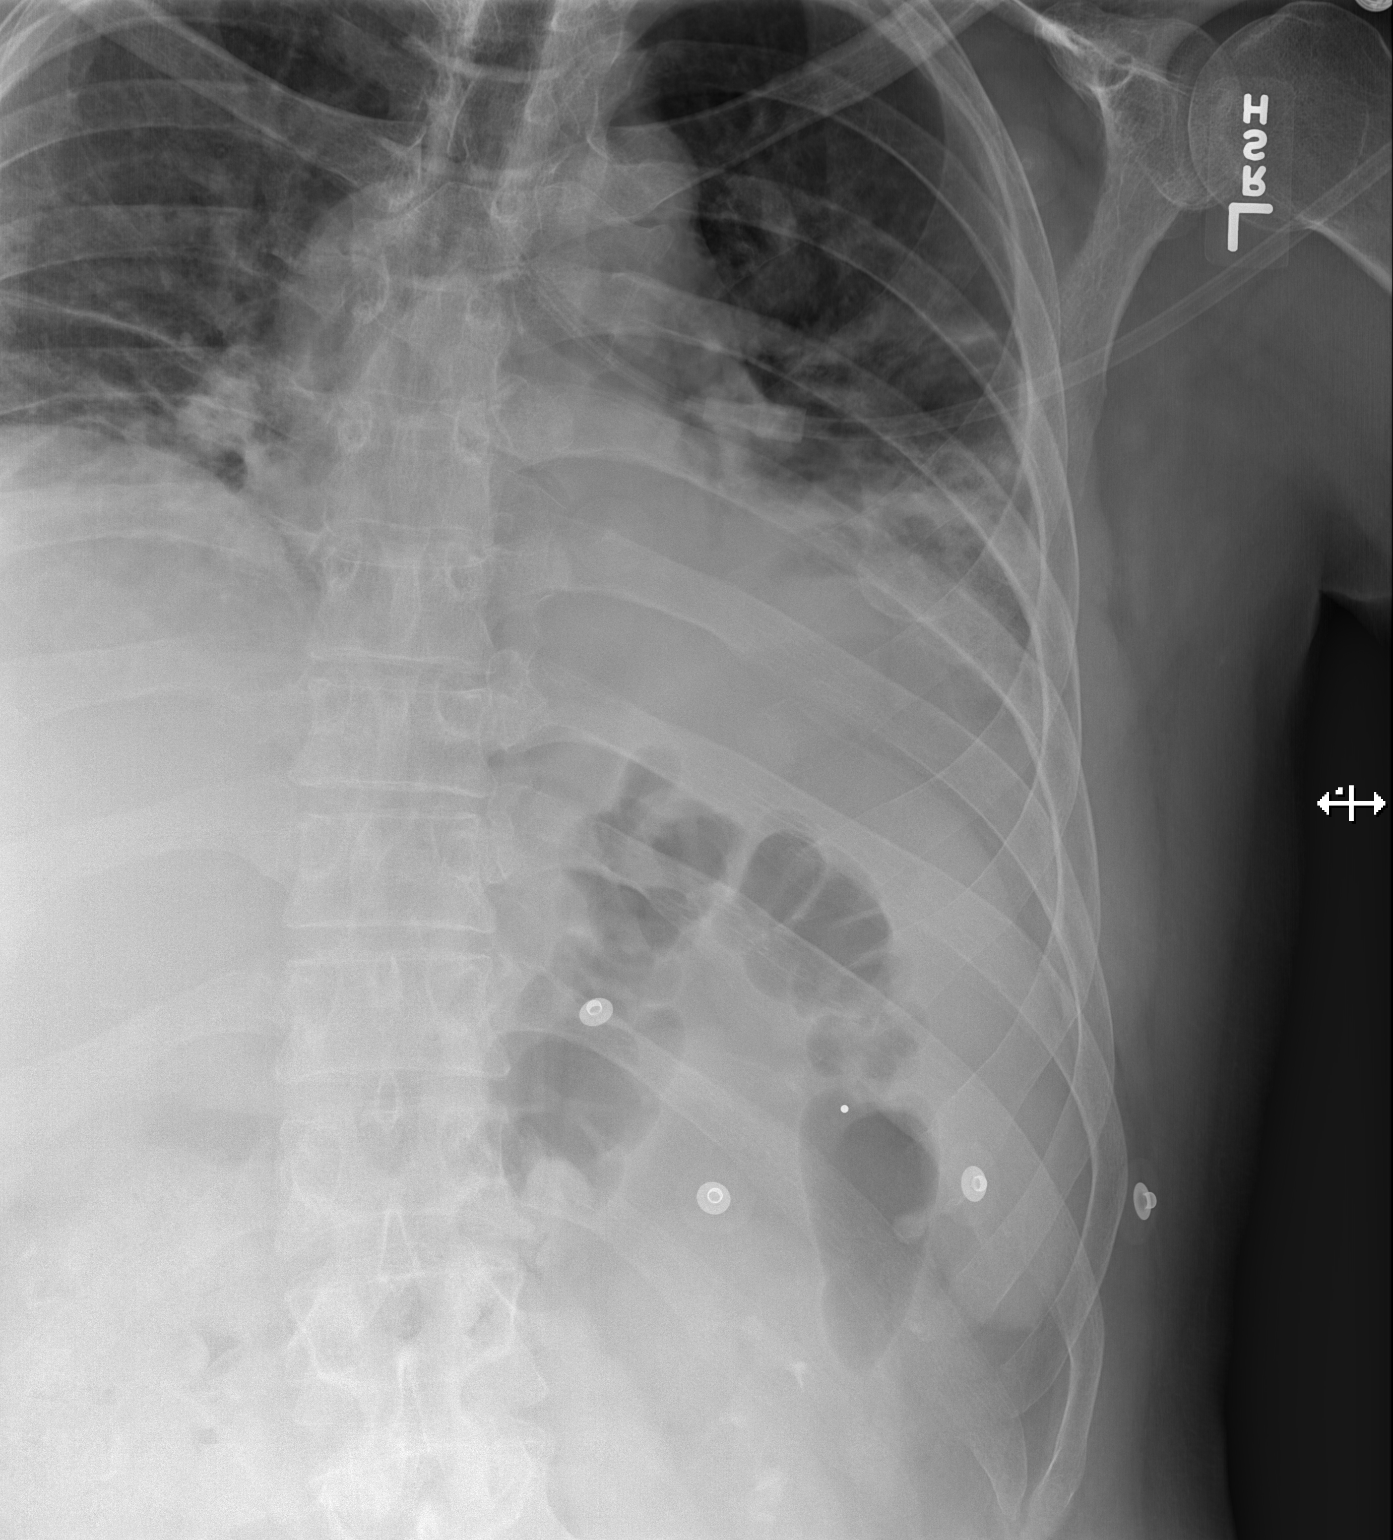

[w ribs obl left]
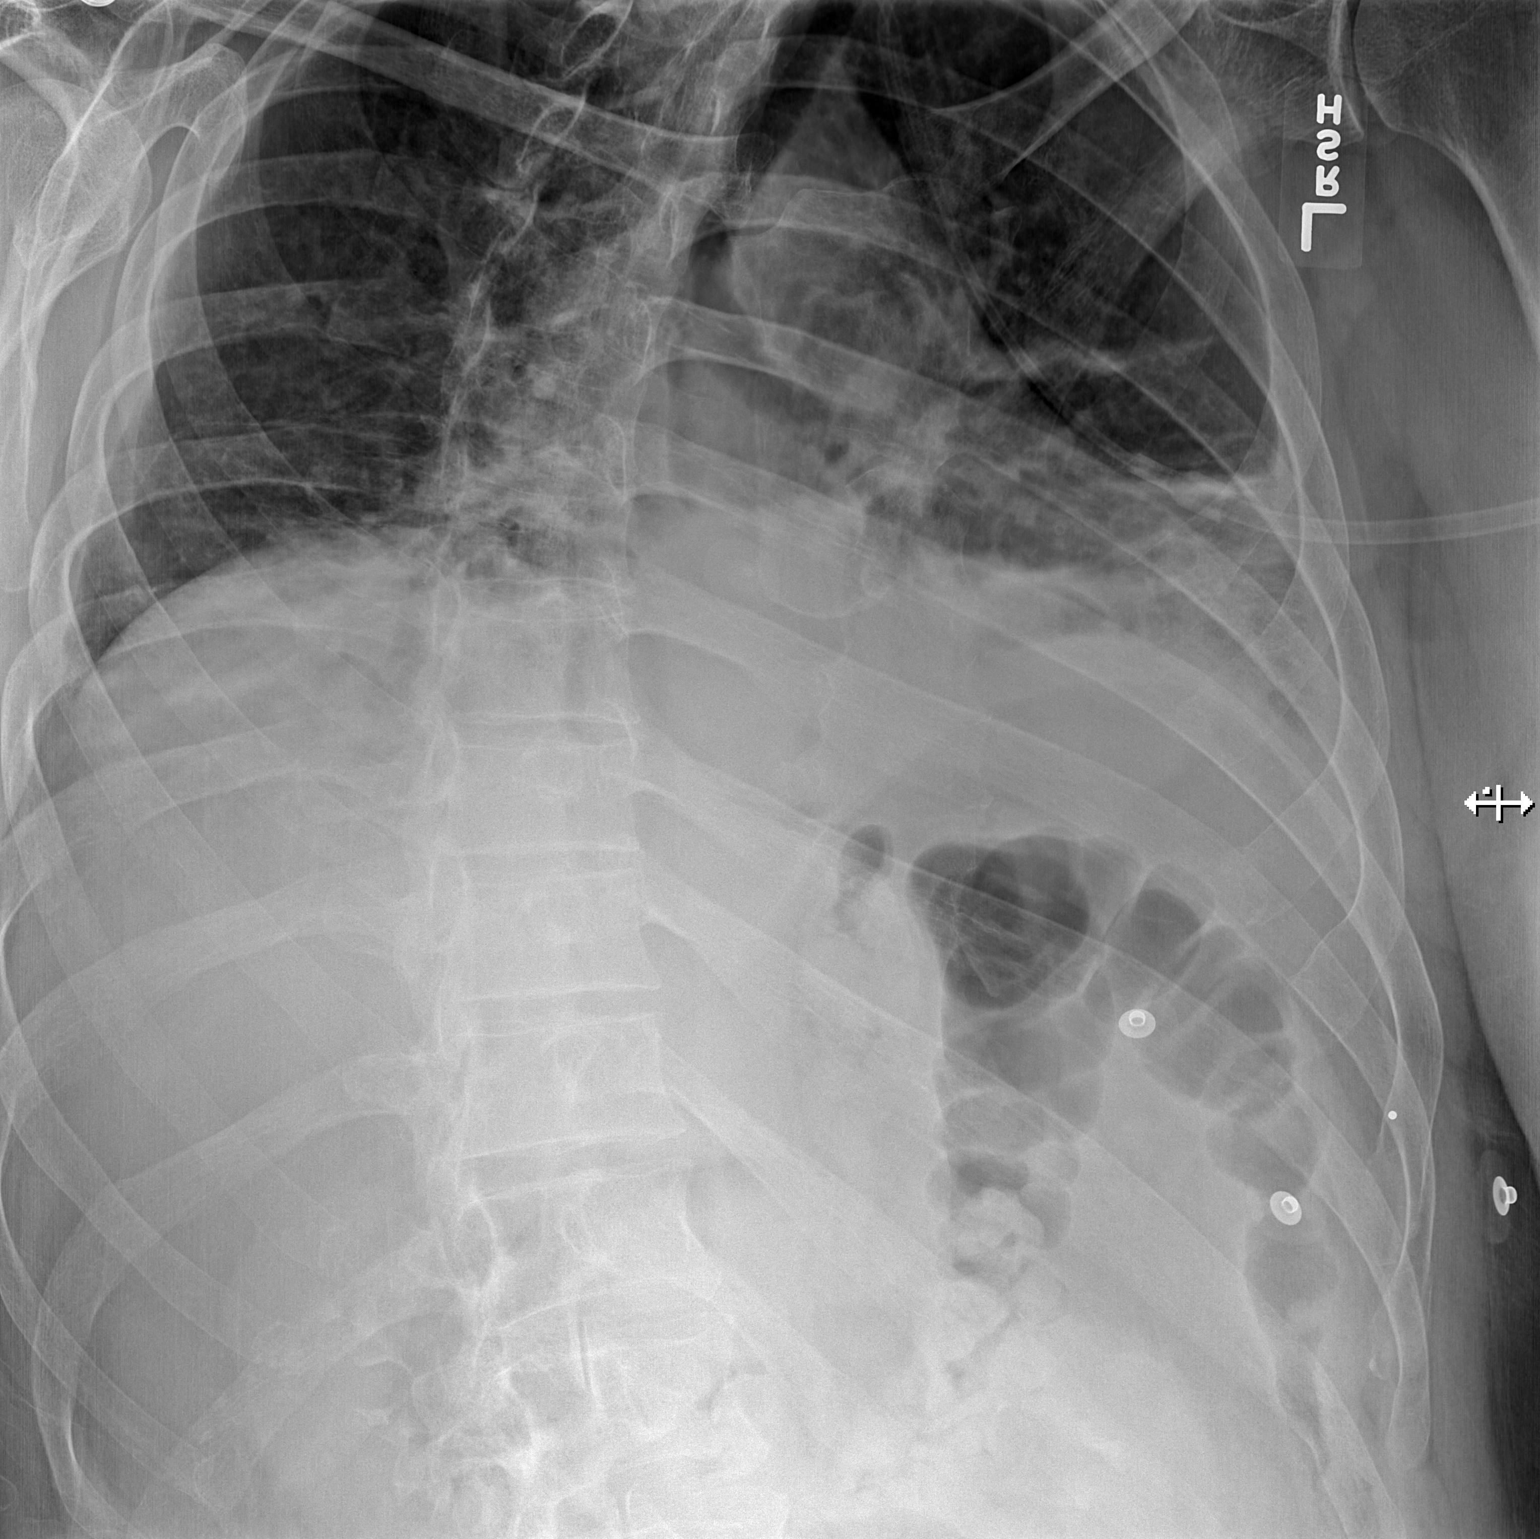

[2 of 2 positions shown; findings below may reference images not displayed]

FINDINGS: Oblique and cone-down lower left rib images were obtained. There is
consolidation in the left lung base. There is node appreciable
pneumothorax or effusion on the left. No rib fracture identified.
IMPRESSION: No rib fracture evident.  Left base consolidation.

## 2017-05-04 IMAGING — CR DG CHEST 2V
2 series · 2 of 2 positions shown · non-contrast
Comparison: Chest x-ray and chest CT 03/24/2015

CLINICAL DATA: Weakness and shortness of breath. Followup pulmonary
infiltrates.

EXAM:
CHEST  2 VIEW

[chest pa]
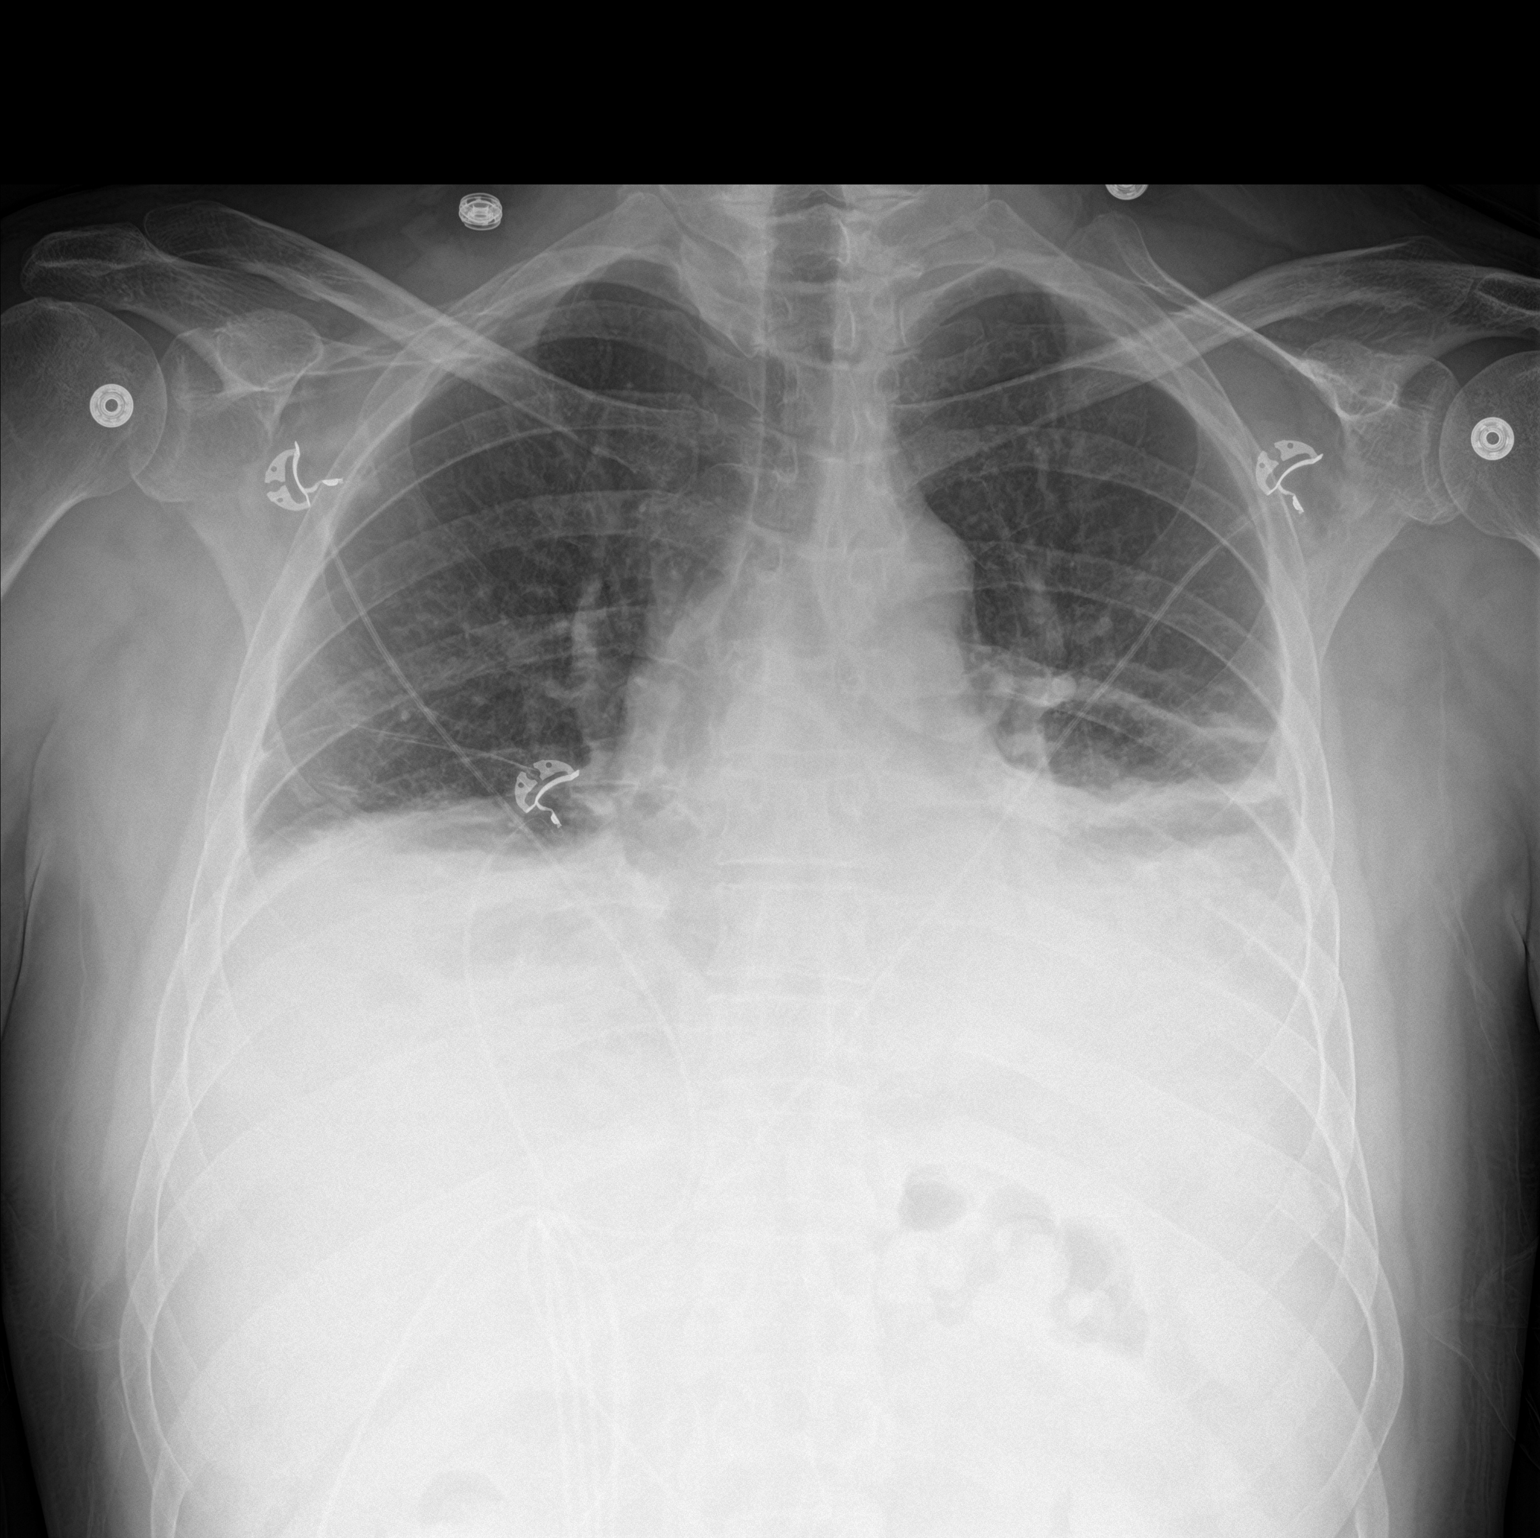

[chest lat]
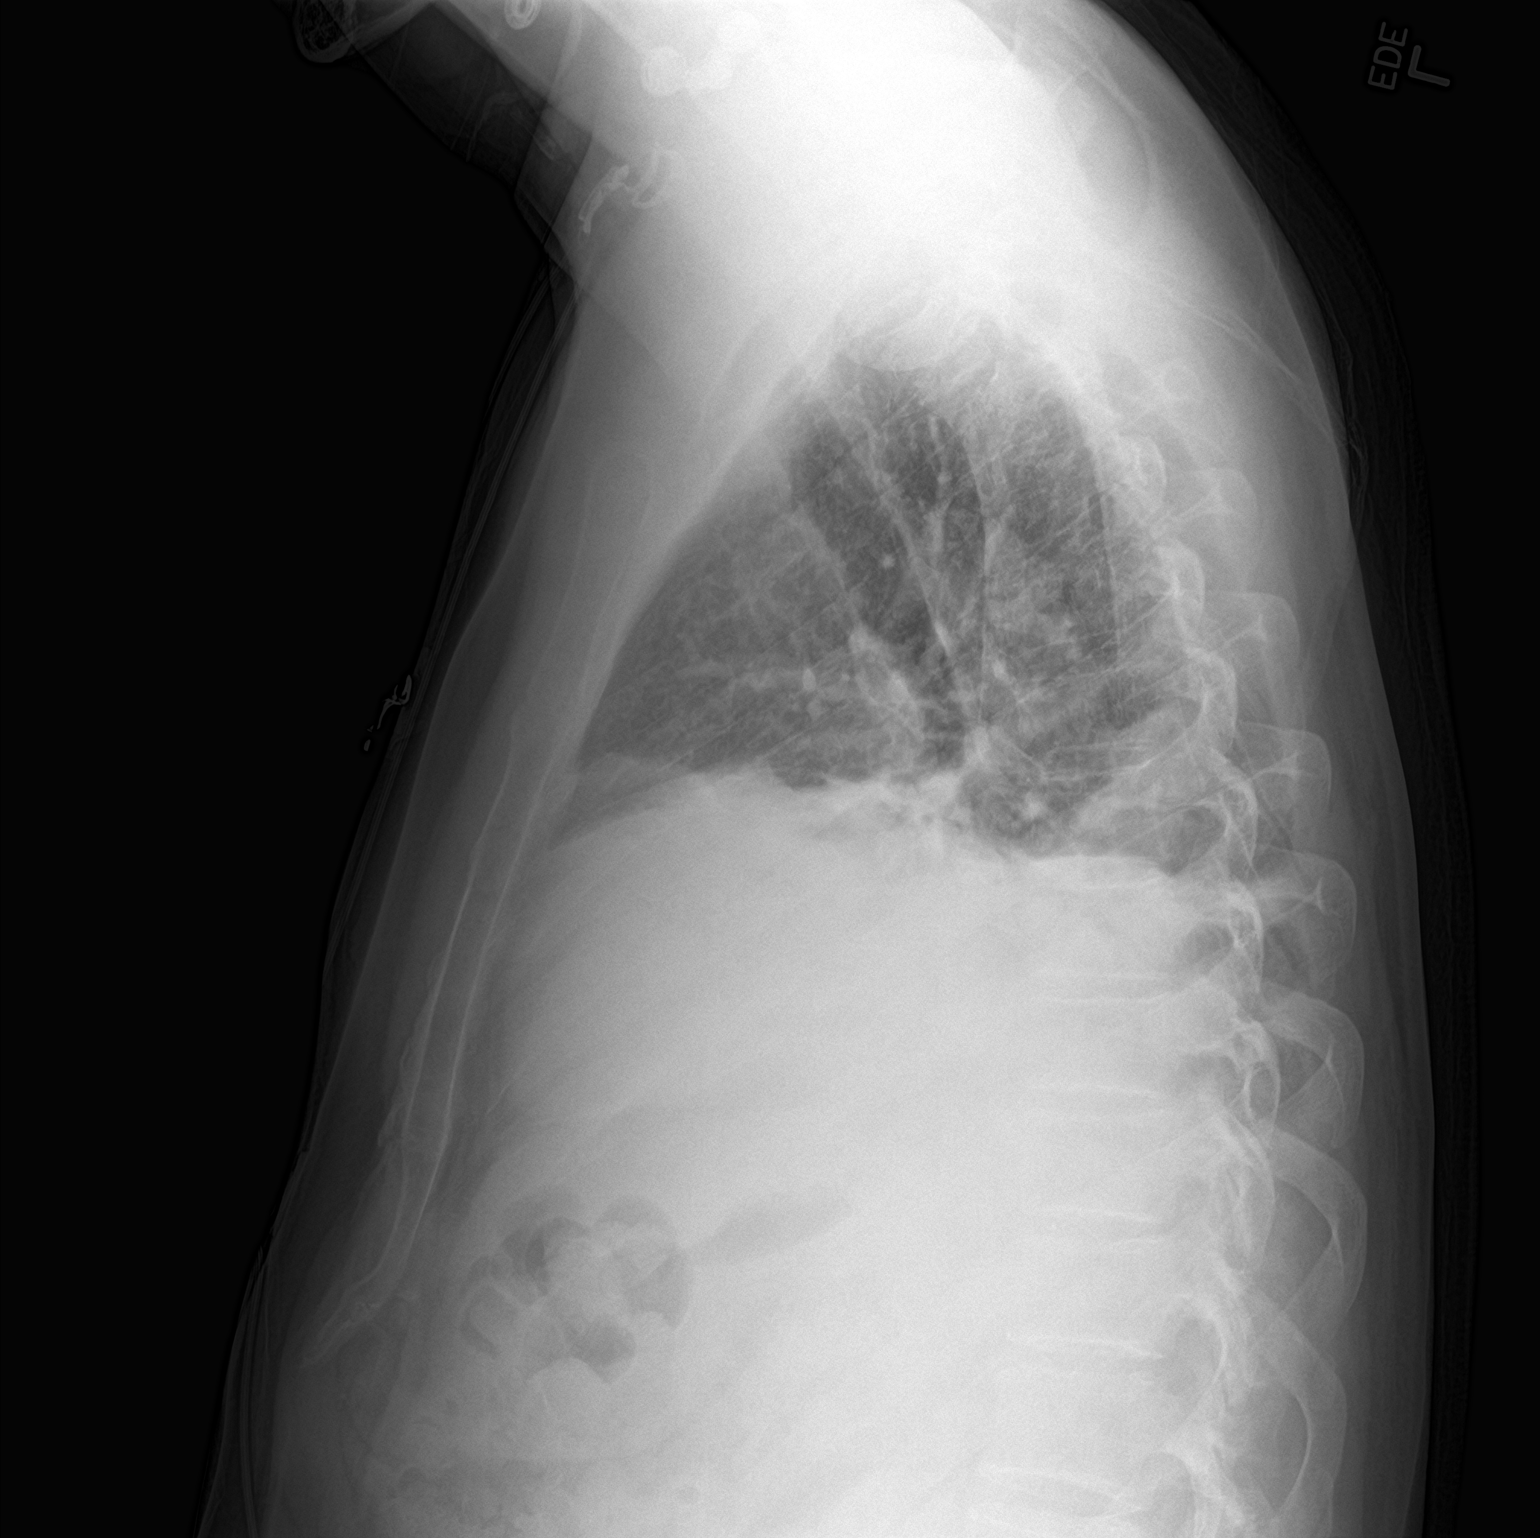

[2 of 2 positions shown; findings below may reference images not displayed]

FINDINGS: The cardiac silhouette, mediastinal an hilar contours are within
normal limits and stable. Persistent bibasilar infiltrates and small
effusions. No pneumothorax.
IMPRESSION: Persistent bibasilar infiltrates and small effusions.
